# Patient Record
Sex: Female | Born: 1955 | Race: Black or African American | Hispanic: No | Smoking: Never smoker
Health system: Southern US, Community
[De-identification: ages and names within clinical notes are randomized; demographics above are authoritative.]

## PROBLEM LIST (undated history)

## (undated) DIAGNOSIS — D509 Iron deficiency anemia, unspecified: Secondary | ICD-10-CM

## (undated) DIAGNOSIS — F329 Major depressive disorder, single episode, unspecified: Secondary | ICD-10-CM

## (undated) DIAGNOSIS — E119 Type 2 diabetes mellitus without complications: Secondary | ICD-10-CM

## (undated) DIAGNOSIS — I10 Essential (primary) hypertension: Secondary | ICD-10-CM

## (undated) DIAGNOSIS — R232 Flushing: Secondary | ICD-10-CM

## (undated) DIAGNOSIS — M2550 Pain in unspecified joint: Secondary | ICD-10-CM

## (undated) DIAGNOSIS — F32A Depression, unspecified: Secondary | ICD-10-CM

## (undated) DIAGNOSIS — E669 Obesity, unspecified: Secondary | ICD-10-CM

## (undated) DIAGNOSIS — J449 Chronic obstructive pulmonary disease, unspecified: Secondary | ICD-10-CM

## (undated) DIAGNOSIS — E7800 Pure hypercholesterolemia, unspecified: Secondary | ICD-10-CM

## (undated) DIAGNOSIS — IMO0002 Reserved for concepts with insufficient information to code with codable children: Secondary | ICD-10-CM

## (undated) HISTORY — DX: Iron deficiency anemia, unspecified: D50.9

## (undated) HISTORY — DX: Obesity, unspecified: E66.9

## (undated) HISTORY — DX: Essential (primary) hypertension: I10

## (undated) HISTORY — DX: Major depressive disorder, single episode, unspecified: F32.9

## (undated) HISTORY — DX: Type 2 diabetes mellitus without complications: E11.9

## (undated) HISTORY — DX: Pain in unspecified joint: M25.50

## (undated) HISTORY — PX: OTHER SURGICAL HISTORY: SHX169

## (undated) HISTORY — PX: ABDOMINAL HYSTERECTOMY: SHX81

## (undated) HISTORY — PX: TUBAL LIGATION: SHX77

## (undated) HISTORY — DX: Reserved for concepts with insufficient information to code with codable children: IMO0002

## (undated) HISTORY — DX: Pure hypercholesterolemia, unspecified: E78.00

## (undated) HISTORY — DX: Depression, unspecified: F32.A

## (undated) HISTORY — DX: Flushing: R23.2

---

## 2003-11-07 ENCOUNTER — Inpatient Hospital Stay (HOSPITAL_COMMUNITY): Admission: AD | Admit: 2003-11-07 | Discharge: 2003-11-09 | Payer: Self-pay | Admitting: Internal Medicine

## 2003-11-16 ENCOUNTER — Ambulatory Visit (HOSPITAL_COMMUNITY): Admission: RE | Admit: 2003-11-16 | Discharge: 2003-11-16 | Payer: Self-pay | Admitting: Obstetrics and Gynecology

## 2003-11-22 ENCOUNTER — Other Ambulatory Visit: Admission: RE | Admit: 2003-11-22 | Discharge: 2003-11-22 | Payer: Self-pay | Admitting: Obstetrics and Gynecology

## 2004-07-27 ENCOUNTER — Inpatient Hospital Stay (HOSPITAL_COMMUNITY): Admission: EM | Admit: 2004-07-27 | Discharge: 2004-07-30 | Payer: Self-pay | Admitting: Emergency Medicine

## 2005-01-16 IMAGING — US US TRANSVAGINAL NON-OB
1 series · 14 of 25 positions shown · non-contrast
Comparison: none

HISTORY: Severe anemia, heavy menstrual bleeding

[Series 1: unknown · 0.32mm/px · 14 of 76 slices shown]
[im 1/76]
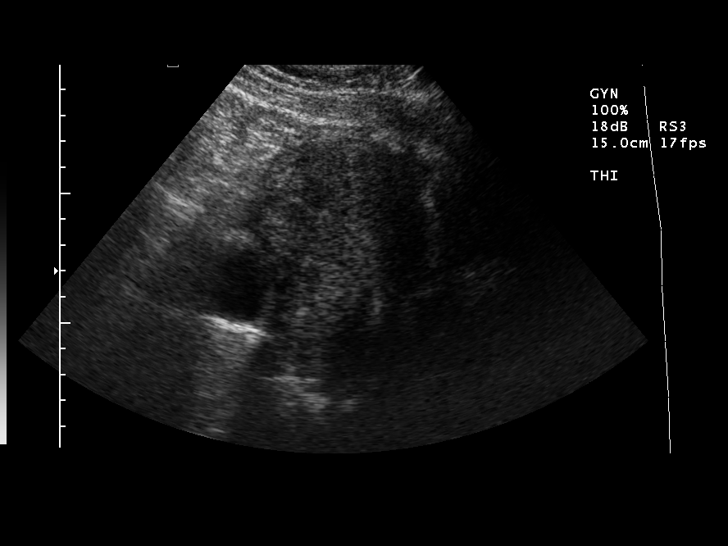
[im 7/76]
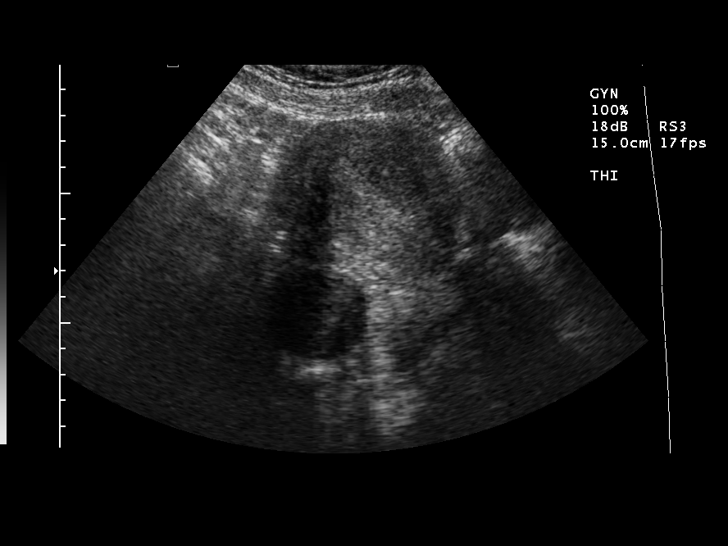
[im 13/76]
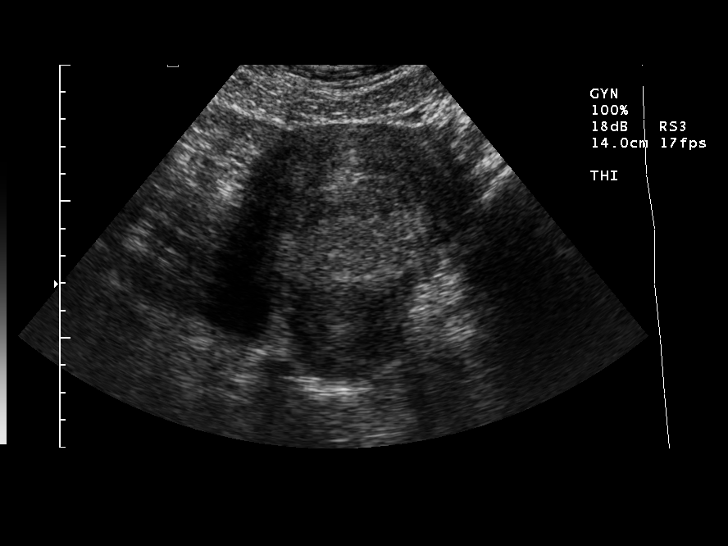
[im 19/76]
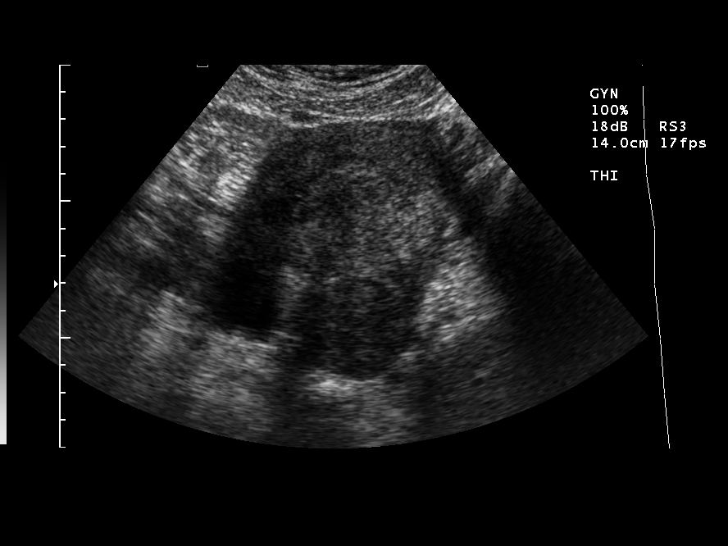
[im 26/76]
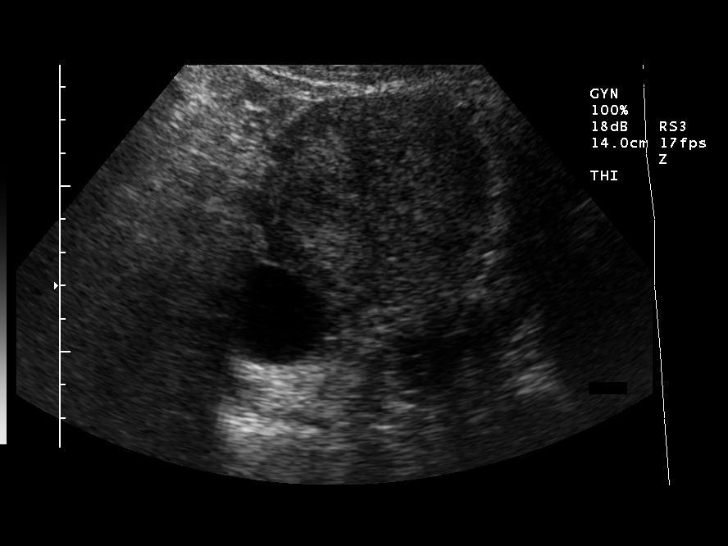
[im 29/76]
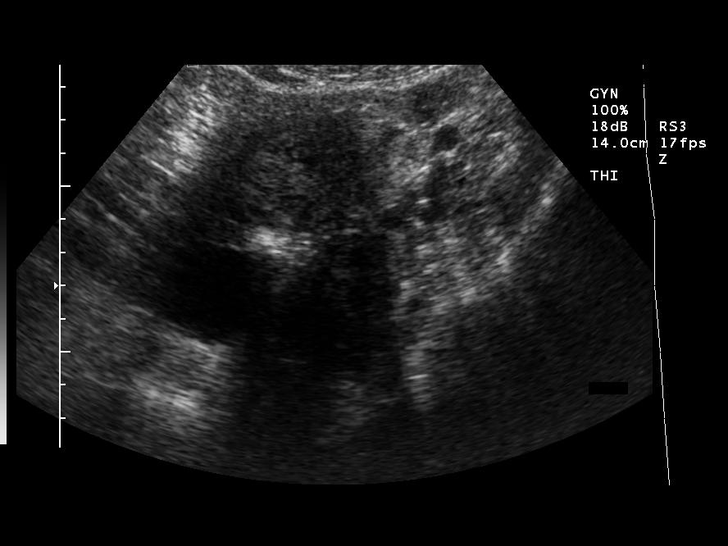
[im 35/76]
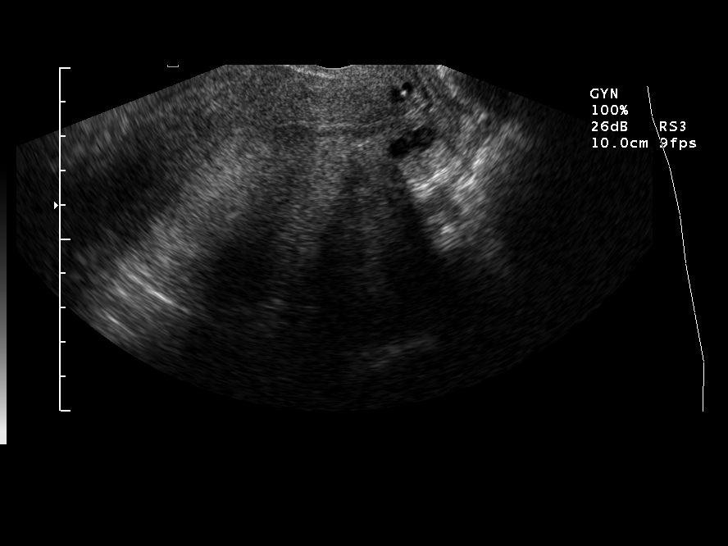
[im 41/76]
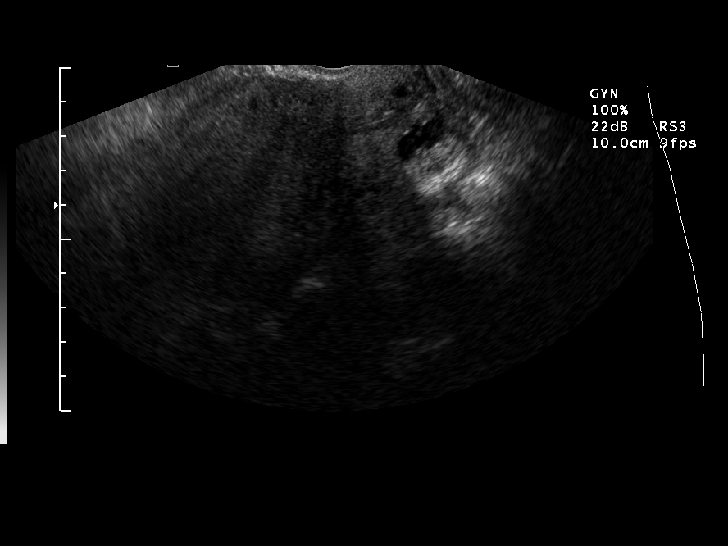
[im 47/76]
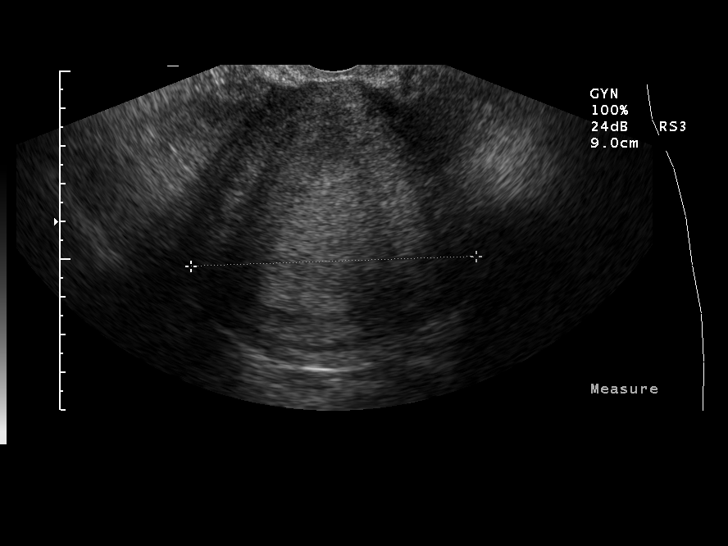
[im 51/76]
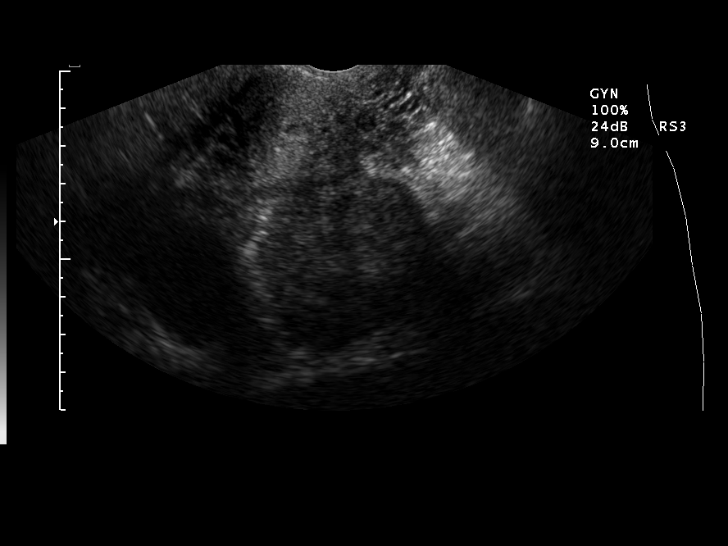
[im 57/76]
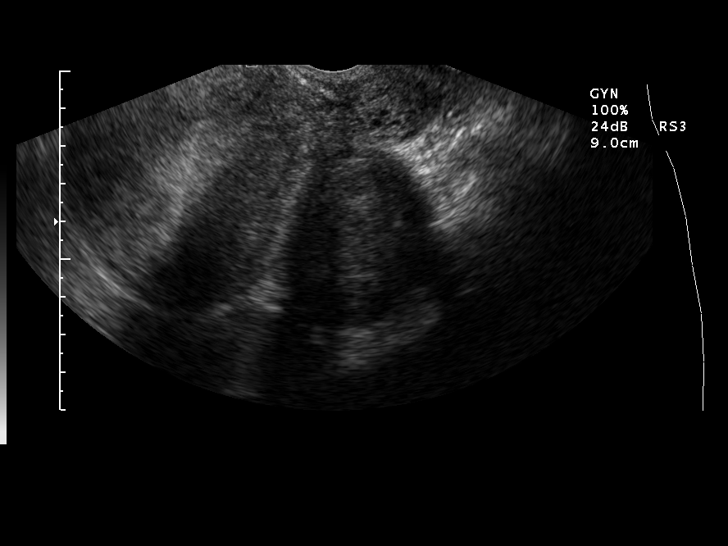
[im 63/76]
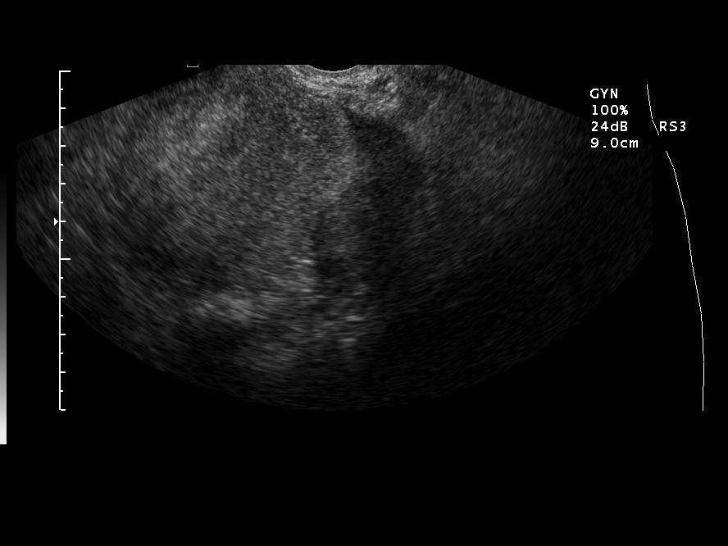
[im 69/76]
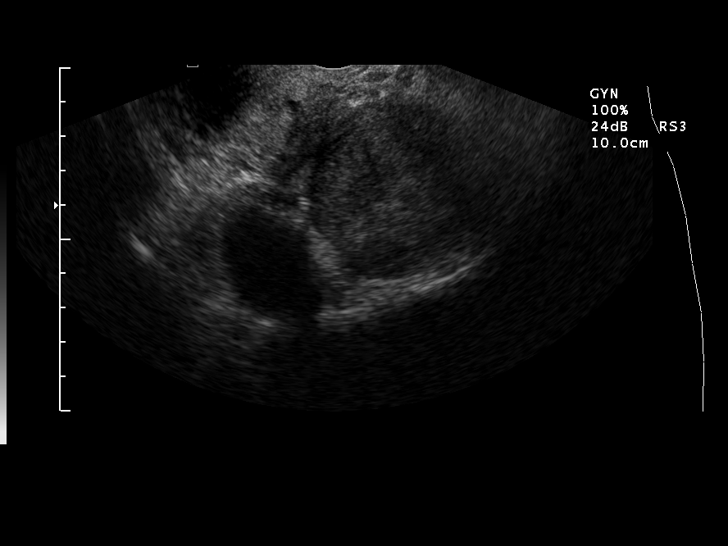
[im 76/76]
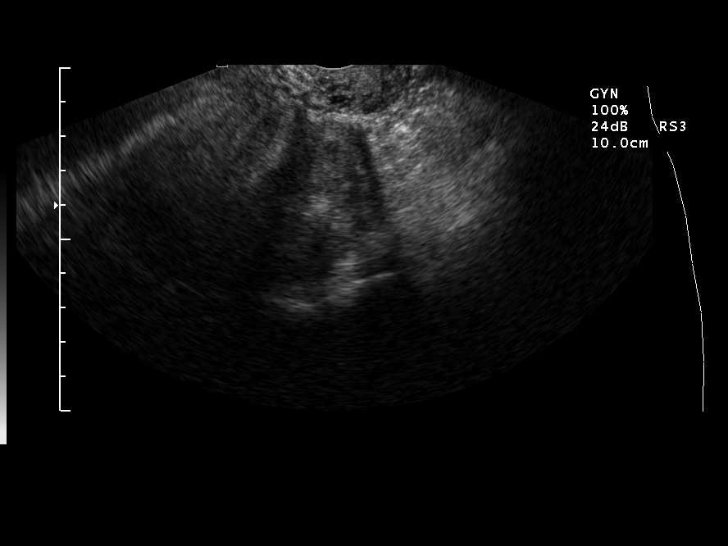

[14 of 25 positions shown; findings below may reference images not displayed]

ULTRASOUND PELVIS COMPLETE MODIFY:
ULTRASOUND PELVIS TRANSVAGINAL NON-OB:

Transabdominal and endovaginal sonography was performed.

Uterus 10.3 cm length x 6.8 cm AP x 7.8 cm transverse.
Endometrial stripe 12 mm thick, minimally prominent for age.
No free pelvic fluid.
Right ovary measures 4.5 x 3.0 x 3.0 cm.
Small simple right ovarian cyst, 3.2 x 2.6 x 3.0 cm.
Solid left adnexal mass identified, 5.3 x 3.8 x 4.7 cm. 
No definite internal blood flow on color Doppler imaging.
It is uncertain whether this represents an exophytic uterine fibroid or a left ovarian mass.
Left ovary not otherwise identified.
IMPRESSION: Small right ovarian cyst. Minimally prominent endometrial stripe.
5.3 cm diameter solid left adnexal mass, question of left ovary origin versus exophytic uterine
fibroid. Recommend additional evaluation by multiplanar MR imaging to localize and characterize.

## 2006-09-26 ENCOUNTER — Emergency Department (HOSPITAL_COMMUNITY): Admission: EM | Admit: 2006-09-26 | Discharge: 2006-09-27 | Payer: Self-pay | Admitting: Emergency Medicine

## 2006-10-08 ENCOUNTER — Ambulatory Visit (HOSPITAL_COMMUNITY): Admission: RE | Admit: 2006-10-08 | Discharge: 2006-10-08 | Payer: Self-pay | Admitting: Family Medicine

## 2006-10-12 ENCOUNTER — Ambulatory Visit (HOSPITAL_COMMUNITY): Admission: RE | Admit: 2006-10-12 | Discharge: 2006-10-12 | Payer: Self-pay | Admitting: Family Medicine

## 2006-11-24 ENCOUNTER — Inpatient Hospital Stay (HOSPITAL_COMMUNITY): Admission: AD | Admit: 2006-11-24 | Discharge: 2006-11-27 | Payer: Self-pay | Admitting: Obstetrics & Gynecology

## 2006-11-25 ENCOUNTER — Encounter: Payer: Self-pay | Admitting: Obstetrics & Gynecology

## 2007-12-13 IMAGING — US US PELVIS COMPLETE MODIFY
1 series · 14 of 25 positions shown · non-contrast
Comparison: none

HISTORY: Uterine enlargement

[Series 1: us pelvis complete modify · 0.26mm/px · 14 of 48 slices shown]
[im 1/48]
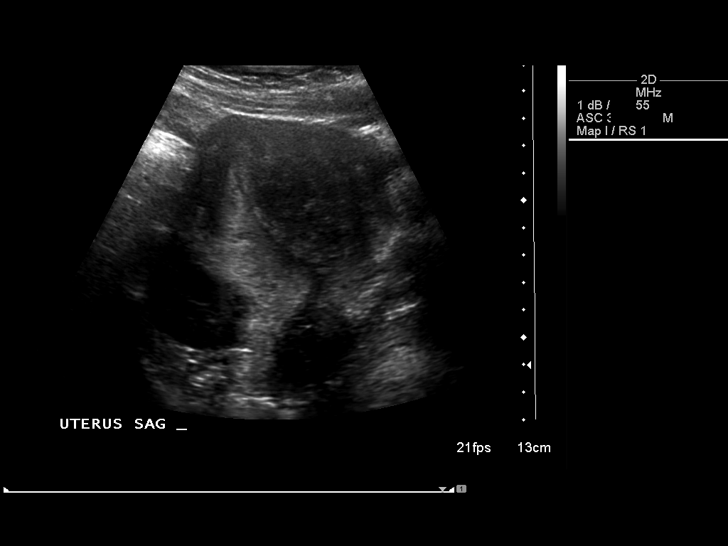
[im 4/48]
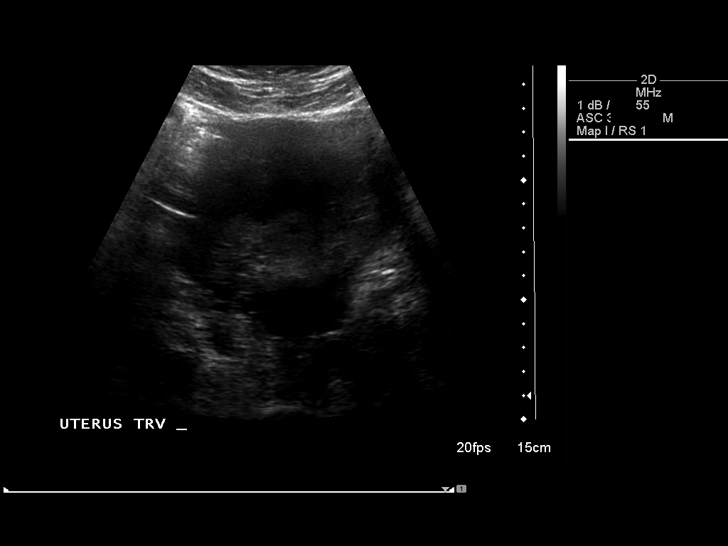
[im 8/48]
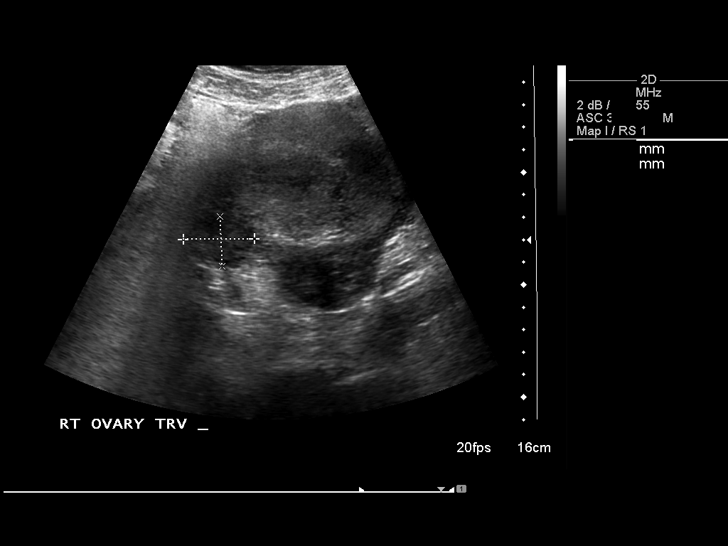
[im 12/48]
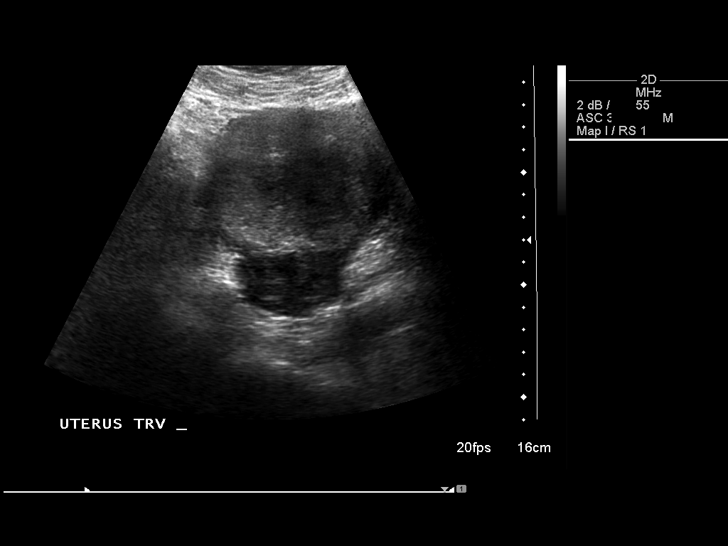
[im 16/48]
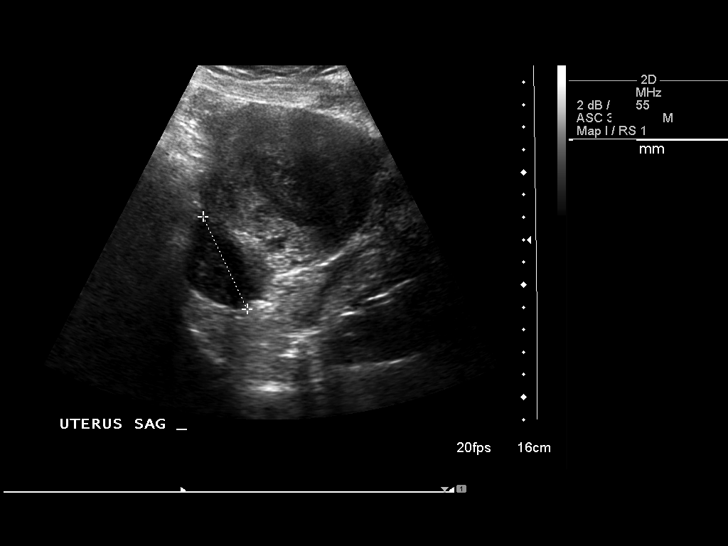
[im 18/48]
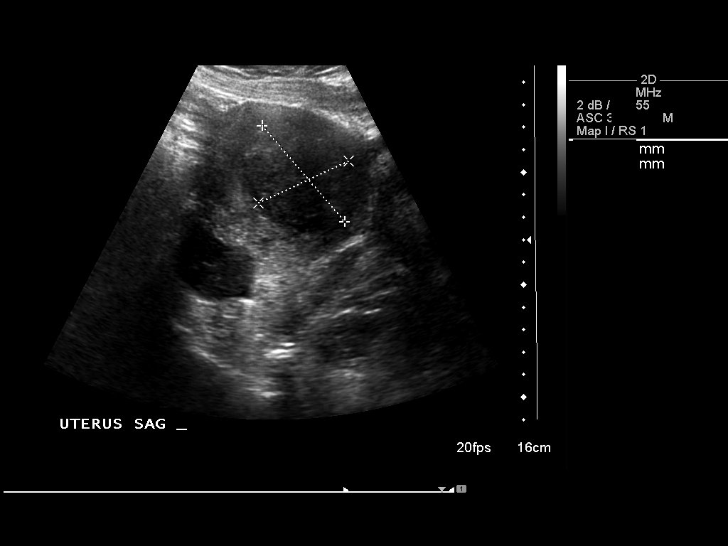
[im 22/48]
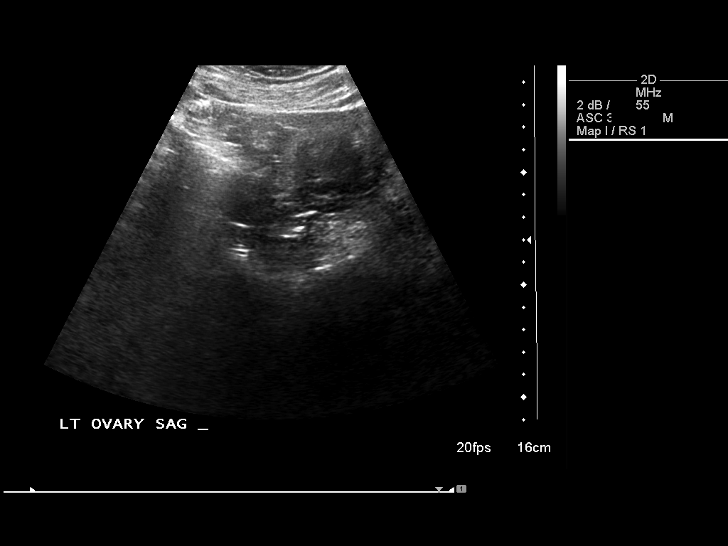
[im 26/48]
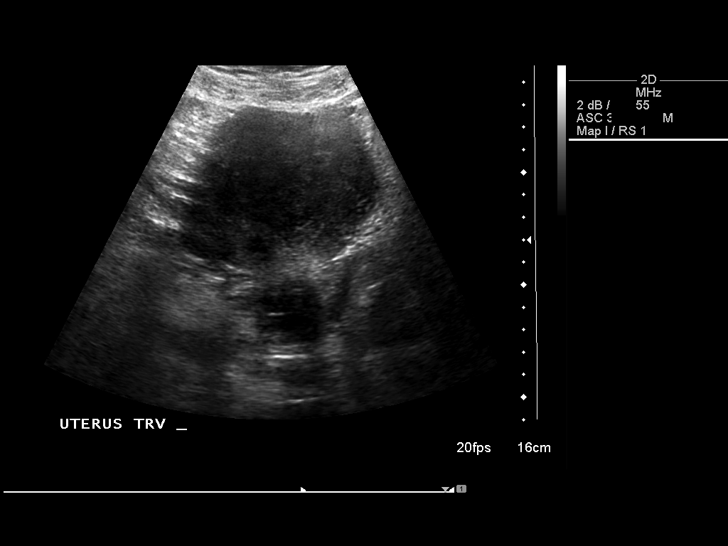
[im 30/48]
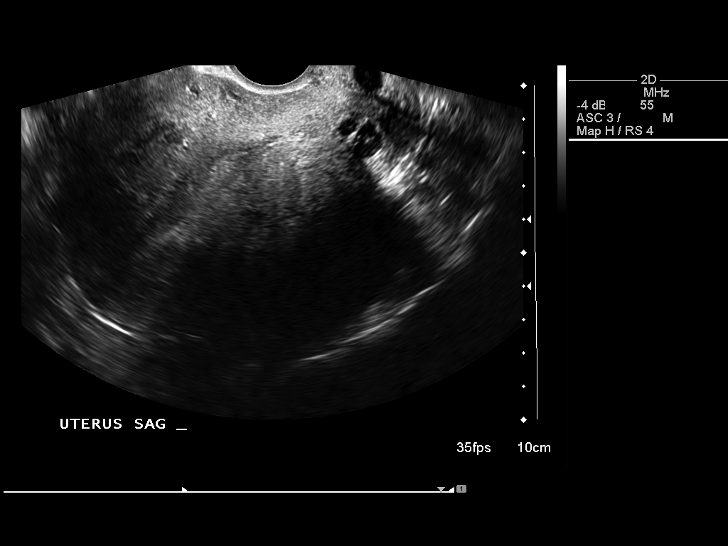
[im 32/48]
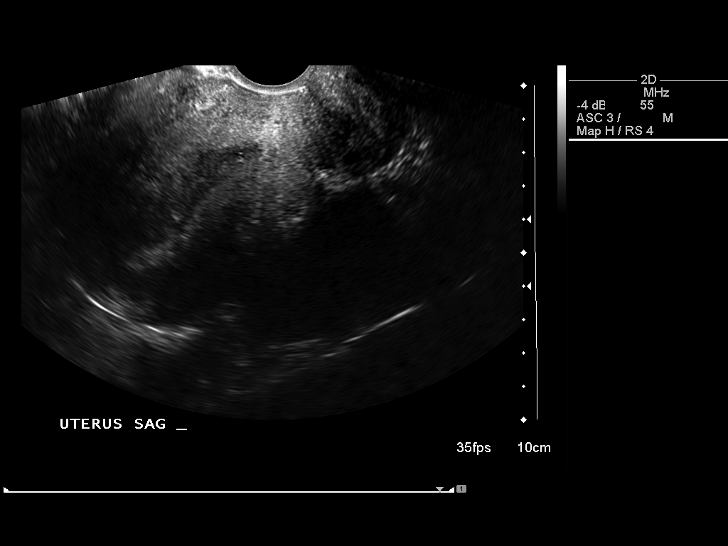
[im 36/48]
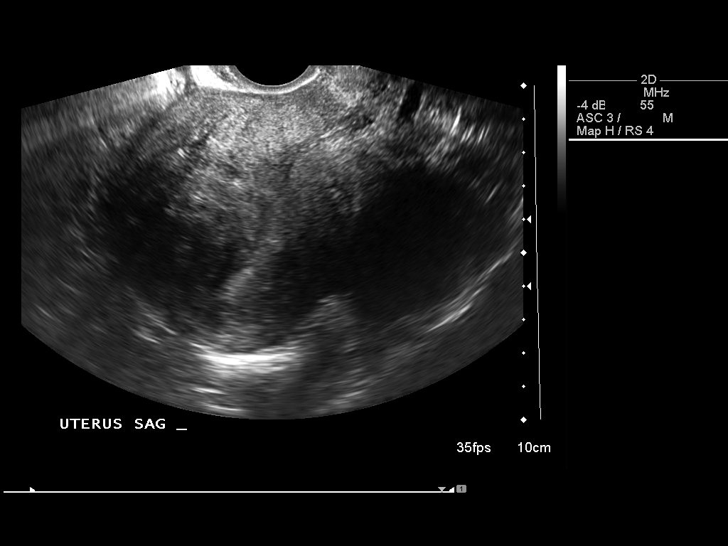
[im 40/48]
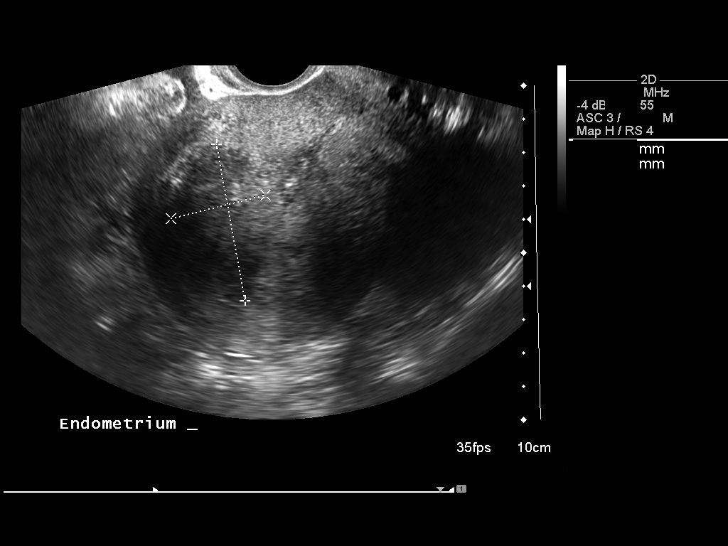
[im 44/48]
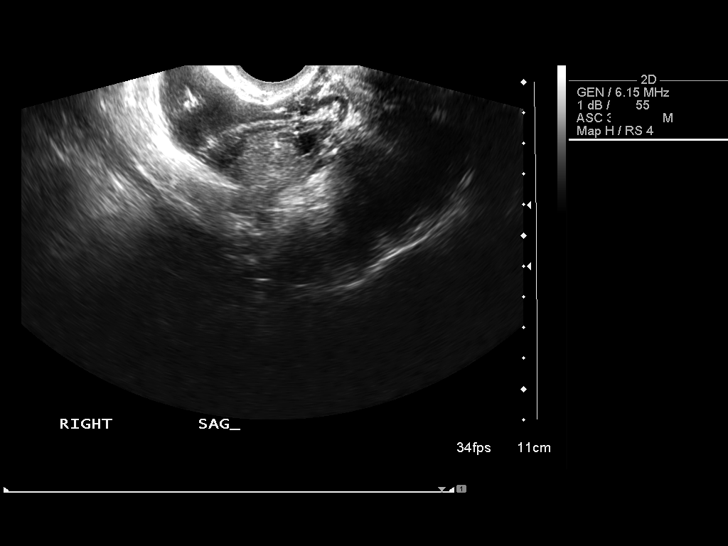
[im 48/48]
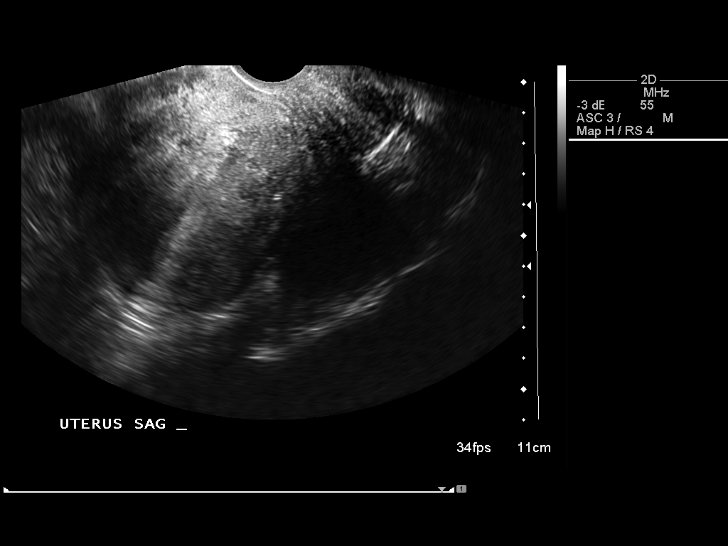

[14 of 25 positions shown; findings below may reference images not displayed]

ULTRASOUND PELVIS TRANSABDOMINAL COMPLETE MODIFIED:
ULTRASOUND PELVIS TRANSVAGINAL NON-OB:

Transabdominal and endovaginal sonography of pelvis performed.
Uterus, ovaries, adnexal regions, and pelvic cul-de-sac assessed.

Uterus measures 10.5 cm length x 6.6 cm AP x 8.3 cm transverse.
Endometrial stripe markedly thickened 16 mm diameter.
Multiple uterine fibroids identified.
Large anterior fibroid mid to upper uterine segment 5.6 x 4.4 x 4.9 cm,
submucosal.
Additional fibroid subserosal at mid to lower uterine segment, 5.2 x 3.3 x
cm.
No endometrial fluid or free pelvic fluid.
Right ovary normal size and morphology, 3.4 x 3.2 x 2.2 cm.
Left ovary normal size and morphology, 3.4 x 1.6 x 2.3 cm.
No adnexal masses.
IMPRESSION: Uterine leiomyomata with overall enlargement of uterus.
Abnormal thickening of endometrial stripe, nonspecific but can be seen with
hyperplasia, polyps, and tumor.
No adnexal abnormalities.

## 2009-06-11 ENCOUNTER — Emergency Department (HOSPITAL_COMMUNITY): Admission: EM | Admit: 2009-06-11 | Discharge: 2009-06-11 | Payer: Self-pay | Admitting: Emergency Medicine

## 2010-06-04 NOTE — H&P (Signed)
NAME:  Amanda [name], Amanda [name] NO.:  [id number]   MEDICAL RECORD NO.:  [id number]          PATIENT TYPE:  INP   LOCATION:  A309                          FACILITY:  [facility]   PHYSICIAN:  Lazaro Arms, M.D.   DATE OF BIRTH:  05/21/1955   DATE OF ADMISSION:  11/24/2006  DATE OF DISCHARGE:  LH                              HISTORY & PHYSICAL   Amanda [name] is a 55 year old African American female who has been seen over  the last year or so in the ER at [facility] and at [facility], at least on  a couple of different occasions, for heavy bleeding and severe anemia.  She was last seen in the ER at [facility] on September 6.  She has a  hemoglobin of 7.8 and 24.7.  We saw her for the first time in the  office, which I saw her on November 10, 2006, at which time her  fingerstick hemoglobin was 8.1.  She was having fatigue, dizzy spells.  At that time I did an exam and she had an enlarged fibroid uterus up to  the umbilicus and ultrasound in the office confirmed that it was an  enlarged fibroid.  I talked with her and her daughter for quite some  time regarding the fact that this was the reason she was having the  heavy vaginal bleeding.  She had been on birth control pills here in the  past and I told her I did not think that was appropriate going forward.  She has really suffered with this on and off for several years and I  recommended abdominal hysterectomy and bilateral salpingo-oophorectomy.  The patient agreed and I brought her in today actually to see if her  hemoglobin was appropriate for surgery or she would have to be  transfused prior to surgery, and it was 8.6 and 27 and I elected to  bring her in and give her 2 units of packed red blood cells  preoperatively, which she agreed with.   PAST MEDICAL HISTORY:  1. Anemia.  2. She has a history of hypertension.  3. She had what might have been a vascular event secondary to being on      birth control pills.   PAST SURGICAL  HISTORY:  Tubal ligation.   PAST OBSTETRICAL HISTORY:  She has had all vaginal deliveries.   She does not smoke, drink or do drugs.   MEDICATIONS:  Megace, which I have placed her on to stop her bleeding,  and they had actually called me about that from the ER as well.   Her last Pap smear was normal.   HEENT:  Normal.  Thyroid is normal.  LUNGS:  Clear.  HEART:  Regular rate and rhythm without murmur, rub, or gallop.  BREASTS:  Without masses, discharge or skin changes.  ABDOMEN:  Benign with a midline mass up to the umbilicus consistent with  a fibroid.  She has normal external genitalia.  The vagina is clean and without  discharge.  Cervix parous without lesions.  Uterus, again, enlarged up  to the umbilicus.  Adnexa  are negative.  EXTREMITIES:  Warm with no edema.  NEUROLOGIC:  Grossly intact.   IMPRESSION:  1. Enlarged fibroid uterus.  2. Severe anemia as a result of menometrorrhagia.  3. Menometrorrhagia.   PLAN:  The patient is admitted for abdominal hysterectomy and bilateral  salpingo-oophorectomy.  She is transfused preoperatively to provide  adequate H&H preoperatively.  The patient understands the rationale for  this.  She will undergo a TAH/BSO on November 25, 2006.      Lazaro Arms, M.D.  Electronically Signed     LHE/MEDQ  D:  11/24/2006  T:  11/25/2006  Job:  [id number]

## 2010-06-04 NOTE — Discharge Summary (Signed)
NAMELITTLE, BASHORE NO.:  [id number]   MEDICAL RECORD NO.:  [id number]          PATIENT TYPE:  INP   LOCATION:  A309                          FACILITY:  [facility]   PHYSICIAN:  Lazaro Arms, M.D.   DATE OF BIRTH:  09/13/1955   DATE OF ADMISSION:  11/24/2006  DATE OF DISCHARGE:  11/07/2008LH                               DISCHARGE SUMMARY   DISCHARGE DIAGNOSES:  1. Status post abdominal hysterectomy and bilateral salpingo-      oophorectomy.  2. Enlarged fibroid uterus.  3. Menometrorrhagia.  4. Anemia requiring transfusion prior to surgery.   Please refer to the history and physical for details of admission to the  hospital.   HOSPITAL COURSE:  The patient had a hematocrit of 27 preoperatively.  As  a result, she was admitted, underwent transfusion of 2 units of packed  red blood cells intraoperatively.  Her course was unremarkable.  She had  a blood loss of 800 mL.  Postoperatively, her H&H settled in at 9.4 and  29.  On postoperative day #2 she was afebrile.  She tolerated clear  liquids and regular diet.  She voided without symptoms.  She was  ambulatory.  Her incision was clean, dry and intact.  Her abdominal exam  was benign.  She was discharged to home on the morning of postoperative  day #2 in good, stable condition to follow up in the office next week to  have her staples removed.  She is discharged on Percocet and Motrin and  will be seen in the office prior to her scheduled appointment if needed.      Lazaro Arms, M.D.  Electronically Signed     LHE/MEDQ  D:  11/27/2006  T:  11/27/2006  Job:  [id number]

## 2010-06-04 NOTE — Op Note (Signed)
NAMEROXAN, YAMAMOTO NO.:  [id number]   MEDICAL RECORD NO.:  [id number]          PATIENT TYPE:  INP   LOCATION:  A309                          FACILITY:  [facility]   PHYSICIAN:  Lazaro Arms, M.D.   DATE OF BIRTH:  03-29-1955   DATE OF PROCEDURE:  11/25/2006  DATE OF DISCHARGE:                               OPERATIVE REPORT   PREOPERATIVE DIAGNOSIS:  1. Enlarged fibroid uterus.  2. Menometrorrhagia.  3. Severe anemia requiring transfusion.   POSTOPERATIVE DIAGNOSIS:  1. Enlarged fibroid uterus.  2. Menometrorrhagia.  3. Severe anemia requiring transfusion.   PROCEDURE:  Abdominal hysterectomy with bilateral salpingo-oophorectomy.   SURGEON:  Lazaro Arms, M.D.   ANESTHESIA:  General endotracheal.   FINDINGS:  The patient had an enlarged fibroid uterus.  She had a  dominant fibroid in the fundus of the uterus and she also had a lower  segment fibroid posteriorly.  Her ovaries and tubes were both adherent  to the posterior fibroid as well as the pelvic side wall as if she may  have had infection at some point in the past.  Otherwise peritoneal  cavity was normal and the cervix appeared to be normal.   DESCRIPTION OF PROCEDURE:  The patient was taken to the operating room,  placed in the supine position, underwent general endotracheal  anesthesia.  The vagina was prepped and the Foley catheter was placed.  The abdomen was prepped and draped in the usual sterile fashion.  A  Pfannenstiel skin incision was made and carried down sharply to the  rectus fascia, scored in the midline, and extended laterally.  The  fascia was taken off the muscles superiorly and inferiorly without  difficulty.  The muscles were divided.  The peritoneal cavity was  entered.  An Alexis self-retaining wound retractor was placed and the  upper abdomen was packed away.  The left round ligament was suture  ligated and cut.  The infundibulopelvic ligament on the right was  isolated,  clamped, cut, and doubly suture ligated.  The left round  ligament was suture ligated and cut and the right infundibulopelvic  ligament was clamped, cut, and doubly suture ligated.  The bladder was  pushed off of the lower uterine segment.  The uterine vessels were  skeletonized.  The uterine fundus was amputated.  The posterior myoma  was removed.  Serial pedicles were taken down the cervix through the  cardinal ligament with each pedicle being clamped, cut, and suture  ligated.  The vagina was then crossclamped, the cervix was removed.  Vaginal angle sutures were placed and the vagina was closed with  interrupted figure-of-eight sutures.  The pelvis was irrigated  vigorously multiple times.  All pedicles were made hemostatic.  Interceed was placed over the vagina.  The patient tolerated the  procedure well.  At this point she experienced 800 mL of blood loss.  The instruments were removed, the Alexis wound retractor was removed,  the muscles from the peritoneum reapproximated loosely, the fascia  closed using 0 Vicryl running,  subcutaneous tissues made hemostatic and irrigated.  Skin was  closed  using skin staples.  The patient tolerated the procedure well.  She  experienced 800 mL of blood loss and taken to the recovery room in good  stable condition.  All needle, sponge, and instrument counts correct x3.      Lazaro Arms, M.D.  Electronically Signed     LHE/MEDQ  D:  11/25/2006  T:  11/25/2006  Job:  [id number]

## 2010-06-07 NOTE — Op Note (Signed)
NAMEVICKE, Amanda [name]               ACCOUNT NO.:  [id number]   MEDICAL RECORD NO.:  [id number]          PATIENT TYPE:  INP   LOCATION:  A322                          FACILITY:  [facility]   PHYSICIAN:  Dirk Dress. Katrinka Blazing, M.D.   DATE OF BIRTH:  10/19/1955   DATE OF PROCEDURE:  07/28/2004  DATE OF DISCHARGE:                                 OPERATIVE REPORT   PREOPERATIVE DIAGNOSIS:  Pilonidal abscess.   POSTOPERATIVE DIAGNOSIS:  Pilonidal abscess.   PROCEDURE:  Wide excision and drainage of pilonidal abscess.   SURGEON:  Dirk Dress. Katrinka Blazing, MD.   DESCRIPTION OF PROCEDURE:  Under spinal anesthesia, the patient's presacral  and perianal area were prepped and draped in a sterile field.  The abscess  cavity was aspirated with a 21-gauge needle.  This fluid was sent for  cultures, aerobic and anaerobic.  The incision was made in the midline  around the small opening and extended down into the presacral area.  A large  volume of pus was removed.  The cavity extended for a length of about 8 cm  and was about 3 to 4 cm wide.  Inflammatory debris was curetted, and  hemostasis was achieved with electrocautery.  The wound was irrigated and  packed with Iodoform gauze.  The patient tolerated the procedure well.  A  sterile dressing was placed.  She was transferred to a bed and taken to the  post-anesthetic care unit for further monitoring.       LCS/MEDQ  D:  07/28/2004  T:  07/28/2004  Job:  [id number]

## 2010-06-07 NOTE — Consult Note (Signed)
Amanda [name], Amanda [name]               ACCOUNT NO.:  [id number]   MEDICAL RECORD NO.:  [id number]          PATIENT TYPE:  INP   LOCATION:  A320                          FACILITY:  [facility]   PHYSICIAN:  R. Roetta Sessions, M.D. DATE OF BIRTH:  [birth year]   DATE OF CONSULTATION:  11/08/2003  DATE OF DISCHARGE:                                   CONSULTATION   REASON FOR CONSULTATION:  Iron deficiency anemia, possible chronic GI bleed.   HISTORY OF PRESENT ILLNESS:  The patient is a 55 year old black female who  was admitted to the hospital with a two day history of nausea and vomiting  and diarrhea.  A couple of days before admission she developed nausea and  vomiting.  She vomited one single episode.  She denies any hematemesis.  Throughout that night she developed watery diarrhea.  She had a few watery  stools the following day and on the day of admission.  She felt very weak.  She was seen by Dr. Felecia Shelling in the office and was felt to be acutely ill and  needed admission.  Since admission she has had three watery stools.  She  denies any melena or rectal bleeding.  No abdominal pain.  No further  vomiting.  No heartburn symptoms.  She states she has regular menstrual  cycles at least once a month.  She has seven days of flow, four-five of  which is quite heavy and sometimes contains clots.  She says she had anemia  a couple of years ago and was hospitalized at [facility].  This was  discovered after she tried to donate blood.  She denies having any  transfusions or any endoscopies at that time.  She was given iron tablets.   On admission her hemoglobin was 7.1, hematocrit 24.2, MCV 61.8.  BUN 8,  creatinine 0.7.  Total bilirubin 0.8.  LFTs were normal except an albumin of  3.2.  Iron 14, TIBC 414, iron saturation is 3%, ferritin 17.  B12 of 963.  Folate 13.1.  Urinalysis was negative.  Hemoccult stool x1 was negative.  She received two units of packed red blood cells.  Her hemoglobin is  10.3,  hematocrit 32.6.  She has never had a colonoscopy or EGD.   MEDICATIONS PRIOR TO ADMISSION:  Occasional Advil for menstrual cramps.   ALLERGIES:  No known drug allergies.   PAST MEDICAL HISTORY:  Negative for chronic illnesses.  She gives a history  of anemia two years ago as outlined above.   PAST SURGICAL HISTORY:  Remote tubal ligation.   FAMILY HISTORY:  Negative for chronic GI _________ colorectal cancer.   OBJECTIVE:  She is recently unemployed.  She was working at [employer] but quit  a couple of weeks ago, as she became quit weak and did not feel well.  She  is separated.  She has two children.  She denies any tobacco, alcohol, or  drug use.   REVIEW OF SYSTEMS:  GASTROINTESTINAL:  See HPI.  GENERAL:  Complaints of  weakness.  CARDIOPULMONARY:  Denies any chest pain or shortness of breath.  PHYSICAL EXAMINATION:  VITAL SIGNS:  Temperature 98.4, pulse 85,  respirations 22, blood pressure 104/65.  GENERAL:  A pleasant well-nourished, well-developed black female in no acute  distress.  SKIN:  Warm and dry.  No jaundice.  HEENT:  The conjunctivae are slightly pale.  Sclerae nonicteric.  Oropharyngeal moist and pink.  No lesions, erythema, or exudates.  NECK:  No lymphadenopathy or thyromegaly.  LUNGS:  Clear to auscultation.  CARDIAC:  Reveals a regular rate and rhythm.  Normal S1 S2.  No murmurs,  rubs, or gallops.  ABDOMEN:  Positive bowel sounds.  Soft, nontender, and nondistended.  No  organomegaly or masses.  EXTREMITIES:  No edema.   LABS:  As mentioned in HPI.  In addition, WBC 7.7, platelets 275,000.  Sodium 133, potassium 3.5, BUN 8, creatinine 0.7, glucose 100.  Total  bilirubin 0.8, alkaline phosphatase 48, AST 24, ALT 22, albumin 3.2, amylase  90, lipase 23.   IMPRESSION/PLAN:  The patient is a 55 year old lady who was admitted with a  couple day history of nausea and vomiting and diarrhea.  She continues to  have watery diarrhea, although this has  lessened.  She may have a viral  gastroenteritis.  More concerning, her profound microcytic anemia with low  iron indices.  She admits to having regular heavy menstrual cycles.  She has  not seen any bloody stools.  Her stool is Hemoccult-negative x1.  Her iron  deficiency anemia may be explained by her heavy menstrual cycles.  However,  I would offer a colonoscopy.  We would consider a colonoscopy after  resolution of acute symptoms, possibly as an outpatient.  I will discuss  this further with Dr. Jena Gauss and further recommendations to be made.   I like to thank Dr. Felecia Shelling for allowing [place] to take part in the care of this  patient.     Lesl   LL/MEDQ  D:  11/08/2003  T:  11/08/2003  Job:  [id number]

## 2010-06-07 NOTE — H&P (Signed)
Amanda [name], Amanda [name]               ACCOUNT NO.:  [id number]   MEDICAL RECORD NO.:  [id number]          PATIENT TYPE:  INP   LOCATION:  A320                          FACILITY:  [facility]   PHYSICIAN:  Tesfaye D. Felecia Shelling, MD   DATE OF BIRTH:  [birth year]   DATE OF ADMISSION:  11/07/2003  DATE OF DISCHARGE:  LH                                HISTORY & PHYSICAL   CHIEF COMPLAINT:  Nausea, vomiting, diarrhea, and dizziness.   HISTORY OF PRESENT ILLNESS:  This is a 55 year old female patient with no  significant past medical history came to the office with the above  complaints.  The patient claims she developed nausea and vomiting about 3-4  days back.  This was accompanied with frequent watery diarrhea.  Her  symptoms progressively got worse.  She became dizzy and weak since this  morning.  She was evaluated in the office and was found to be acutely sick-  looking and very weak.  The patient was very pale.  She had no history of  hematemesis or melena.  However, the patient has a history of heavy  menstrual flow.  About two years back the patient claims she was found to be  anemic, and she was admitted to [facility] and was treated.   REVIEW OF SYSTEMS:  The patient has a headache.  No fever, cough, chest  pain, shortness of breath, dysuria, urgency, or frequency of urination.   PAST MEDICAL HISTORY:  History of anemia, two years back.  She was admitted  and treated here at [facility].  The patient does not know the  cause of her anemia.   MEDICATIONS:  The patient is not on any regular medication.   SOCIAL HISTORY:  The patient is currently unemployed.  She is separated from  her husband.  The patient has two children.  No history of alcohol, tobacco,  or substance abuse.   PHYSICAL EXAMINATION:  GENERAL:  The patient is alert, awake, and sick-  looking.  VITAL SIGNS:  Blood pressure 99/66, pulse 96, respiratory rate 16,  temperature 97 degrees Fahrenheit.  HEENT:   Pupils are equal reactive.  Conjunctivae are pale.  NECK:  Supple.  CHEST:  Clear lung fields, good air entry.  CARDIOVASCULAR:  First and second heart sounds heard.  No murmur, no gallop.  ABDOMEN:  Soft and lax.  Bowel sounds are positive.  No mass, no  organomegaly.  EXTREMITIES:  No leg edema.   LABS ON ADMISSION:  WBC 8.6, hemoglobin 7.1, hematocrit 24.2, platelets 280,  MCV 61.8.  Sodium 133, potassium 3.5, chloride 103, carbon dioxide 25,  glucose 100, BUN 8, creatinine 0.7.  Alkaline phosphatase 48, SGOT 24, SGPT  22, total protein 7.3, albumin 3.2, calcium 8.8.  Amylase 90 and lipase 23.  Urinalysis is negative.   ASSESSMENT:  1.  Acute gastroenteritis, probably a viral etiology.  2.  Severe microcytic anemia, etiology is not clear at this time.  The      differential diagnoses include chronic gastrointestinal blood loss or      probably dysfunctional uterine  bleeding.   PLAN:  1.  We will continue the patient on IV fluid.  2.  We will type, cross match, and transfuse two units of packed red blood      cells.  3.  We will do an anemia workup.  4.  We will do a GI consult for evaluation of possible chronic GI bleed.  5.  We will keep the patient on a clear liquid diet until her symptoms      totally resolve.     Tesf   TDF/MEDQ  D:  11/07/2003  T:  11/07/2003  Job:  [id number]

## 2010-06-07 NOTE — Discharge Summary (Signed)
Amanda [name], Amanda [name]               ACCOUNT NO.:  [id number]   MEDICAL RECORD NO.:  [id number]          PATIENT TYPE:  INP   LOCATION:  A320                          FACILITY:  [facility]   PHYSICIAN:  Tesfaye D. Felecia Shelling, MD   DATE OF BIRTH:  [birth year]   DATE OF ADMISSION:  DATE OF DISCHARGE:  10/20/2005LH                                 DISCHARGE SUMMARY   DISCHARGE DIAGNOSES:  1.  Acute gastroenteritis.  2.  Microcytic anemia.   DISCHARGE MEDICATIONS:  Ferrous sulfate 325 mg p.o. daily.   DISPOSITION:  The patient will be discharged home in a stable condition.   HOSPITAL COURSE:  This is a 55 year old female patient with a history of  anemia who was admitted due to nausea, vomiting and dizziness.  She was also  found to have severe anemia for which the patient received two units of  packed red blood cells.  Her occult stool test became negative.  However,  the patient has a history of prolonged menstrual flow.  Her symptoms of  nausea, vomiting and diarrhea stopped.  The patient was treated as a case of  acute gastroenteritis.  GI consultation was done, and the patient is planned  for outpatient colonoscopy.  The patient is going to be discharged home in  stable condition to be followed in the office.     Tesf   TDF/MEDQ  D:  11/09/2003  T:  11/09/2003  Job:  [id number]

## 2010-06-07 NOTE — Discharge Summary (Signed)
[name], [name]               ACCOUNT NO.:  [id number]   MEDICAL RECORD NO.:  [id number]          PATIENT TYPE:  INP   LOCATION:  A322                          FACILITY:  [facility]   PHYSICIAN:  Dirk Dress. Katrinka Blazing, M.D.   DATE OF BIRTH:  [birth year]   DATE OF ADMISSION:  07/27/2004  DATE OF DISCHARGE:  07/11/2006LH                                 DISCHARGE SUMMARY   DISCHARGE DIAGNOSIS:  Pilonidal abscess.   PROCEDURE:  Wide excision and drainage of pilonidal abscess on July 9.   DISPOSITION:  The patient discharged home in stable, satisfactory condition.   DISCHARGE MEDICATIONS:  1.  Augmentin 1000 mg daily.  2.  Ferrous sulfate 325 mg daily.  3.  Darvocet-N 100 1-2 every 4 hours as needed for pain.   FOLLOW UP:  The patient is scheduled to be seen in the office in 2 weeks.   WOUND CARE:  She is advised to flushed the wound with warm soap and water.  Cover the area with perineal pad twice a day.   HOSPITAL COURSE:  This is a 55 year old female with a 4-week history of a  painful presacral mass with gradual increase in size over the past few days  prior to admission.  The pain became intolerable.  She was seen in the  emergency room and was found to have a very large pilonidal abscess.  She  was otherwise clinically stable.  She was observed overnight and taken to  the operating room the following morning were wide excision of the area was  carried out.  She did very well and was discharged home on the afternoon of  postop day #2 in satisfactory condition.      Dirk Dress. Katrinka Blazing, M.D.  Electronically Signed     LCS/MEDQ  D:  09/15/2004  T:  09/16/2004  Job:  [id number]

## 2010-09-25 ENCOUNTER — Emergency Department (HOSPITAL_COMMUNITY)
Admission: EM | Admit: 2010-09-25 | Discharge: 2010-09-25 | Disposition: A | Discharge status: Home / Self Care | Payer: Self-pay | Attending: Emergency Medicine | Admitting: Emergency Medicine

## 2010-09-25 DIAGNOSIS — L0231 Cutaneous abscess of buttock: Secondary | ICD-10-CM | POA: Insufficient documentation

## 2010-09-25 DIAGNOSIS — H9209 Otalgia, unspecified ear: Secondary | ICD-10-CM | POA: Insufficient documentation

## 2010-09-25 MED ORDER — SULFAMETHOXAZOLE-TRIMETHOPRIM 800-160 MG PO TABS: 1.0000 | ORAL_TABLET | Freq: Two times a day (BID) | ORAL | Status: AC

## 2010-09-25 NOTE — ED Notes (Signed)
1. Left earache and facial swelling x 1 month 2,. Wants abcess to sacral area checked, first noted 2 weeks ago, has been draining and is painful

## 2010-09-25 NOTE — ED Notes (Signed)
Pt self ambulated with a steady gait out of the er stating no needs

## 2010-09-25 NOTE — ED Provider Notes (Signed)
History     CSN: [id number] Arrival date & time: 09/25/2010  6:30 AM  Chief Complaint  Patient presents with  . Otalgia  . Wound Check   HPI Comments: Patient presents with 2 complaints, first is left ear pain for 1 month which is constant, associated with decreased hearing in the ear and pain. Nothing makes better or worse, symptoms are moderate at this time. She denies toothaches, fever, visual changes, sore throat, difficulty breathing.  Next complaint is abscess to the gluteal crease. She states this has been going on for more than a week, she has been using warm soaks in the tub and noticed that it drained prior to arrival today with some pus and some blood. The symptoms are mild, persistent, gradually improving.  Patient is a 55 y.o. female presenting with ear pain and wound check. The history is provided by the patient.  Otalgia Pertinent negatives include no rhinorrhea, no sore throat, no abdominal pain, no diarrhea, no vomiting and no cough.  Wound Check     History reviewed. No pertinent past medical history.  Past Surgical History  Procedure Date  . Abdominal hysterectomy   . Tubal ligation     History reviewed. No pertinent family history.  History  Substance Use Topics  . Smoking status: Never Smoker   . Smokeless tobacco: Not on file  . Alcohol Use: No    OB History    Grav Para Term Preterm Abortions TAB SAB Ect Mult Living                  Review of Systems  Constitutional: Negative for fever and chills.  HENT: Positive for ear pain. Negative for sore throat, rhinorrhea and dental problem.   Eyes: Negative for visual disturbance.  Respiratory: Negative for cough, chest tightness and shortness of breath.   Gastrointestinal: Negative for nausea, vomiting, abdominal pain, diarrhea and constipation.  Skin:       Abscess    Physical Exam  BP 136/76  Pulse 94  Temp(Src) 98 F (36.7 C) (Oral)  Resp 20  Ht 5\' 8"  (1.727 m)  Wt 160 lb (72.576 kg)  BMI  24.33 kg/m2  SpO2 98%  Physical Exam  Nursing note and vitals reviewed. Constitutional: She appears well-developed and well-nourished. No distress.  HENT:  Head: Normocephalic and atraumatic.  Mouth/Throat: Oropharynx is clear and moist. No oropharyngeal exudate.       Right tympanic membrane normal, left tympanic membrane is opacified with a yellow purulent liquid nature of behind it. No tenderness with manipulation of the auricle or the tragus.  Eyes: Conjunctivae and EOM are normal. Pupils are equal, round, and reactive to light. Right eye exhibits no discharge. Left eye exhibits no discharge. No scleral icterus.  Neck: Normal range of motion. Neck supple. No JVD present. No thyromegaly present.  Cardiovascular: Normal rate, regular rhythm, normal heart sounds and intact distal pulses.  Exam reveals no gallop and no friction rub.   No murmur heard. Pulmonary/Chest: Effort normal and breath sounds normal. No respiratory distress. She has no wheezes. She has no rales.  Abdominal: Soft. Bowel sounds are normal. She exhibits no distension and no mass. There is no tenderness.  Musculoskeletal: Normal range of motion. She exhibits no edema and no tenderness.  Lymphadenopathy:    She has no cervical adenopathy.  Neurological: She is alert. Coordination normal.  Skin: Skin is warm and dry. No erythema.       0.5 cm nodule consistent with drained  abscess in the mid gluteal cleft. No surrounding cellulitis. No perirectal abscess.  Psychiatric: She has a normal mood and affect. Her behavior is normal.    ED Course  Procedures  MDM Overall patient is well appearing with no fever or tachycardia. We'll prescribe antibiotics to cover both the ear and potential abscess. Will take twice a day for 10 days Septra and return to see ear nose and throat specialist who I have referred her to on her paperwork. She has expressed her understanding to      Vida Roller, MD 09/25/10 [phone]

## 2010-10-29 LAB — COMPREHENSIVE METABOLIC PANEL
ALT: 24
AST: 20
Albumin: 3.6
Alkaline Phosphatase: 47
BUN: 15
CO2: 26
Calcium: 9.4
Chloride: 108
Creatinine, Ser: 0.75
GFR calc Af Amer: >60
GFR calc non Af Amer: >60
Glucose, Bld: 100 — ABNORMAL HIGH
Potassium: 3.8
Sodium: 138
Total Bilirubin: 0.5
Total Protein: 7.3

## 2010-10-29 LAB — URINALYSIS, ROUTINE W REFLEX MICROSCOPIC
Bilirubin Urine: NEGATIVE
Glucose, UA: NEGATIVE
Hgb urine dipstick: NEGATIVE
Ketones, ur: NEGATIVE
Nitrite: NEGATIVE
Protein, ur: NEGATIVE
Specific Gravity, Urine: >1.03 — ABNORMAL HIGH
Urobilinogen, UA: 0.2
pH: 5.5

## 2010-10-29 LAB — ABO/RH: ABO/RH(D): B POS

## 2010-10-29 LAB — DIFFERENTIAL
Basophils Absolute: 0
Basophils Absolute: 0
Basophils Absolute: 0
Basophils Absolute: 0.1
Basophils Relative: 0
Basophils Relative: 0
Basophils Relative: 0
Basophils Relative: 1
Eosinophils Absolute: 0
Eosinophils Absolute: 0.1
Eosinophils Absolute: 0.1
Eosinophils Absolute: 0.1
Eosinophils Relative: 0
Eosinophils Relative: 1
Eosinophils Relative: 2
Eosinophils Relative: 2
Lymphocytes Relative: 18
Lymphocytes Relative: 24
Lymphocytes Relative: 29
Lymphocytes Relative: 8 — ABNORMAL LOW
Lymphs Abs: 1
Lymphs Abs: 1.5
Lymphs Abs: 1.9
Lymphs Abs: 2.1
Monocytes Absolute: 0.4
Monocytes Absolute: 0.7
Monocytes Absolute: 0.8 — ABNORMAL HIGH
Monocytes Absolute: 0.9 — ABNORMAL HIGH
Monocytes Relative: 7
Monocytes Relative: 7
Monocytes Relative: 7
Monocytes Relative: 8
Neutro Abs: 11 — ABNORMAL HIGH
Neutro Abs: 3.3
Neutro Abs: 5.8
Neutro Abs: 8 — ABNORMAL HIGH
Neutrophils Relative %: 62
Neutrophils Relative %: 66
Neutrophils Relative %: 74
Neutrophils Relative %: 85 — ABNORMAL HIGH

## 2010-10-29 LAB — CBC
HCT: 27 — ABNORMAL LOW
HCT: 27.8 — ABNORMAL LOW
HCT: 28.7 — ABNORMAL LOW
HCT: 32.9 — ABNORMAL LOW
Hemoglobin: 10.7 — ABNORMAL LOW
Hemoglobin: 8.6 — ABNORMAL LOW
Hemoglobin: 9 — ABNORMAL LOW
Hemoglobin: 9.4 — ABNORMAL LOW
MCHC: 31.9
MCHC: 32.3
MCHC: 32.7
MCHC: 32.8
MCV: 68.4 — ABNORMAL LOW
MCV: 72.4 — ABNORMAL LOW
MCV: 72.9 — ABNORMAL LOW
MCV: 73.6 — ABNORMAL LOW
Platelets: 297
Platelets: 324
Platelets: 335
Platelets: 355
RBC: 3.78 — ABNORMAL LOW
RBC: 3.94
RBC: 3.95
RBC: 4.54
RDW: 19.8 — ABNORMAL HIGH
RDW: 21.4 — ABNORMAL HIGH
RDW: 21.6 — ABNORMAL HIGH
RDW: 22 — ABNORMAL HIGH
WBC: 10.8 — ABNORMAL HIGH
WBC: 12.9 — ABNORMAL HIGH
WBC: 5.3
WBC: 8.9

## 2010-10-29 LAB — CROSSMATCH
ABO/RH(D): B POS
Antibody Screen: NEGATIVE

## 2010-10-29 LAB — HEMOGLOBIN AND HEMATOCRIT, BLOOD
HCT: 27.5 — ABNORMAL LOW
Hemoglobin: 8.9 — ABNORMAL LOW

## 2010-10-29 LAB — PREPARE RBC (CROSSMATCH)

## 2010-10-29 LAB — HCG, SERUM, QUALITATIVE: Preg, Serum: NEGATIVE

## 2010-11-01 LAB — DIFFERENTIAL
Basophils Absolute: 0
Basophils Relative: 0
Eosinophils Absolute: 0.1
Eosinophils Relative: 1
Lymphocytes Relative: 33
Lymphs Abs: 1.8
Monocytes Absolute: 0.3
Monocytes Relative: 6
Neutro Abs: 3.4
Neutrophils Relative %: 60

## 2010-11-01 LAB — CBC
HCT: 24.7 — ABNORMAL LOW
Hemoglobin: 7.8 — CL
MCHC: 31.7
MCV: 66.5 — ABNORMAL LOW
Platelets: 326
RBC: 3.72 — ABNORMAL LOW
RDW: 22.3 — ABNORMAL HIGH
WBC: 5.6

## 2010-11-01 LAB — BASIC METABOLIC PANEL
BUN: 12
CO2: 25
Calcium: 9.4
Chloride: 105
Creatinine, Ser: 0.67
GFR calc Af Amer: >60
GFR calc non Af Amer: >60
Glucose, Bld: 144 — ABNORMAL HIGH
Potassium: 3.3 — ABNORMAL LOW
Sodium: 138

## 2012-07-21 DIAGNOSIS — R079 Chest pain, unspecified: Secondary | ICD-10-CM

## 2012-11-11 ENCOUNTER — Telehealth: Payer: Self-pay

## 2012-11-11 NOTE — Telephone Encounter (Signed)
Pt called this afternoon. She said that this number called her and what did I want. I asked her to identify herself and I would see who may have tried calling her. She refused to tell me a name and hung up. I put in the phone number that called and it looks like DS is trying to reach patient for a triage. [phone]

## 2012-11-11 NOTE — Telephone Encounter (Signed)
Pt was referred by [facility] Dept for screening colonoscopy. ( FH of colon polyps/ not sure what kind). Tried to call pt and could not leave a VM. Letter mailed to pt to call.

## 2012-11-15 NOTE — Telephone Encounter (Signed)
Tried to call to triage. Could not leave a message.

## 2012-11-23 ENCOUNTER — Other Ambulatory Visit: Payer: Self-pay

## 2012-11-23 DIAGNOSIS — Z1211 Encounter for screening for malignant neoplasm of colon: Secondary | ICD-10-CM

## 2012-11-23 NOTE — Telephone Encounter (Signed)
Gastroenterology Pre-Procedure Review  Request Date: 11/23/2012 Requesting Physician: Kizzie Furnish  [facility])  PATIENT REVIEW QUESTIONS: The patient responded to the following health history questions as indicated:    1. Diabetes Melitis: no 2. Joint replacements in the past 12 months: no 3. Major health problems in the past 3 months: no 4. Has an artificial valve or MVP: no 5. Has a defibrillator: no 6. Has been advised in past to take antibiotics in advance of a procedure like teeth cleaning: no    MEDICATIONS & ALLERGIES:    Patient reports the following regarding taking any blood thinners:   Plavix? no Aspirin? YES Coumadin? no  Patient confirms/reports the following medications:  Current Outpatient Prescriptions  Medication Sig Dispense Refill  . aspirin 81 MG tablet Take 81 mg by mouth daily.      . cholecalciferol (VITAMIN D) 1000 UNITS tablet Take 1,000 Units by mouth daily. Takes 2000 IU daily      . fish oil-omega-3 fatty acids 1000 MG capsule Take 2 g by mouth daily.      . NON FORMULARY Ventolin Inhaler     As directed prn       No current facility-administered medications for this visit.    Patient confirms/reports the following allergies:  No Known Allergies  No orders of the defined types were placed in this encounter.    AUTHORIZATION INFORMATION Primary Insurance:   ID #:   Group #:  Pre-Cert / Auth required: Pre-Cert / Auth #:   Secondary Insurance:   ID #:   Group #:  Pre-Cert / Auth required: Pre-Cert / Auth #:   SCHEDULE INFORMATION: Procedure has been scheduled as follows:  Date:12/06/2012               Time:  2:00 PM Location: [facility] Short Stay  This Gastroenterology Pre-Precedure Review Form is being routed to the following provider(s): R. Roetta Sessions, MD

## 2012-11-24 ENCOUNTER — Ambulatory Visit: Payer: Self-pay | Admitting: Family Medicine

## 2012-11-24 MED ORDER — PEG-KCL-NACL-NASULF-NA ASC-C 100 G PO SOLR: 1.0000 | ORAL | Status: DC

## 2012-11-24 NOTE — Telephone Encounter (Signed)
Rx sent to the pharmacy and instructions mailed to pt.  

## 2012-11-24 NOTE — Telephone Encounter (Signed)
Appropriate.

## 2012-11-25 ENCOUNTER — Encounter (HOSPITAL_COMMUNITY): Payer: Self-pay | Admitting: Pharmacy Technician

## 2012-11-26 ENCOUNTER — Other Ambulatory Visit (HOSPITAL_COMMUNITY): Payer: Self-pay | Admitting: Family Medicine

## 2012-11-26 DIAGNOSIS — Z139 Encounter for screening, unspecified: Secondary | ICD-10-CM

## 2012-11-30 ENCOUNTER — Telehealth: Payer: Self-pay

## 2012-11-30 NOTE — Telephone Encounter (Signed)
Pt called and said she had gotten her prescription for the prep, but has not received the prep instructions. I confirmed her address again and sent another copy. ( It was mailed on 11/24/2012). I went over the instructions with her and she will call back on Friday morning if she has not received them. Her procedure is scheduled for 12/06/2012.).

## 2012-12-06 ENCOUNTER — Ambulatory Visit (HOSPITAL_COMMUNITY)
Admission: RE | Admit: 2012-12-06 | Discharge: 2012-12-06 | Disposition: A | Discharge status: Home / Self Care | Payer: Medicaid Other | Source: Ambulatory Visit | Attending: Internal Medicine | Admitting: Internal Medicine

## 2012-12-06 ENCOUNTER — Encounter (HOSPITAL_COMMUNITY)
Admission: RE | Disposition: A | Discharge status: Home / Self Care | Payer: Self-pay | Source: Ambulatory Visit | Attending: Internal Medicine

## 2012-12-06 ENCOUNTER — Encounter (HOSPITAL_COMMUNITY): Payer: Self-pay

## 2012-12-06 DIAGNOSIS — D126 Benign neoplasm of colon, unspecified: Secondary | ICD-10-CM

## 2012-12-06 DIAGNOSIS — Z83719 Family history of colon polyps, unspecified: Secondary | ICD-10-CM | POA: Insufficient documentation

## 2012-12-06 DIAGNOSIS — J449 Chronic obstructive pulmonary disease, unspecified: Secondary | ICD-10-CM | POA: Insufficient documentation

## 2012-12-06 DIAGNOSIS — Z1211 Encounter for screening for malignant neoplasm of colon: Secondary | ICD-10-CM

## 2012-12-06 DIAGNOSIS — Z8371 Family history of colonic polyps: Secondary | ICD-10-CM

## 2012-12-06 DIAGNOSIS — J4489 Other specified chronic obstructive pulmonary disease: Secondary | ICD-10-CM | POA: Insufficient documentation

## 2012-12-06 HISTORY — PX: COLONOSCOPY: SHX5424

## 2012-12-06 HISTORY — DX: Chronic obstructive pulmonary disease, unspecified: J44.9

## 2012-12-06 SURGERY — COLONOSCOPY
Anesthesia: Moderate Sedation

## 2012-12-06 MED ORDER — ONDANSETRON HCL 4 MG/2ML IJ SOLN
INTRAMUSCULAR | Status: DC
Start: 2012-12-06 — End: 2012-12-06
  Filled 2012-12-06: qty 2

## 2012-12-06 MED ORDER — SODIUM CHLORIDE 0.9 % IV SOLN
INTRAVENOUS | Status: DC
  Administered 2012-12-06: 13:00:00 via INTRAVENOUS

## 2012-12-06 MED ORDER — MIDAZOLAM HCL 5 MG/5ML IJ SOLN
INTRAMUSCULAR | Status: DC | PRN
  Administered 2012-12-06 (×2): 2 mg via INTRAVENOUS
  Administered 2012-12-06: 1 mg via INTRAVENOUS

## 2012-12-06 MED ORDER — MEPERIDINE HCL 100 MG/ML IJ SOLN
INTRAMUSCULAR | Status: DC | PRN
  Administered 2012-12-06 (×2): 25 mg via INTRAVENOUS
  Administered 2012-12-06: 50 mg via INTRAVENOUS

## 2012-12-06 MED ORDER — MIDAZOLAM HCL 5 MG/5ML IJ SOLN
INTRAMUSCULAR | Status: DC
Start: 2012-12-06 — End: 2012-12-06
  Filled 2012-12-06: qty 10

## 2012-12-06 MED ORDER — SIMETHICONE 40 MG/0.6ML PO SUSP
ORAL | Status: DC | PRN
  Administered 2012-12-06: 14:00:00

## 2012-12-06 MED ORDER — ONDANSETRON HCL 4 MG/2ML IJ SOLN
INTRAMUSCULAR | Status: DC | PRN
  Administered 2012-12-06: 4 mg via INTRAVENOUS

## 2012-12-06 MED ORDER — MEPERIDINE HCL 100 MG/ML IJ SOLN
INTRAMUSCULAR | Status: AC
  Filled 2012-12-06: qty 2

## 2012-12-06 NOTE — Op Note (Signed)
[facility] [address] [place], [address]   COLONOSCOPY PROCEDURE REPORT  PATIENT: Amanda [name], Amanda [name]  MR#:         [id number] BIRTHDATE: 11-28-1955 , 56  yrs. old GENDER: Female ENDOSCOPIST: R.  Roetta Sessions, MD FACP [facility] REFERRED BY:  Health Department [facility] PROCEDURE DATE:  12/06/2012 PROCEDURE:     Ileocolonoscopy with snare polypectomy  INDICATIONS: First ever colorectal cancer screening examination; younger sister is in a polyp surveillance program by report.  INFORMED CONSENT:  The risks, benefits, alternatives and imponderables including but not limited to bleeding, perforation as well as the possibility of a missed lesion have been reviewed.  The potential for biopsy, lesion removal, etc. have also been discussed.  Questions have been answered.  All parties agreeable. Please see the history and physical in the medical record for more information.  MEDICATIONS: Versed 5 mg IV and Demerol 100 mg IV in divided doses. Zofran 4 mg IV.  DESCRIPTION OF PROCEDURE:  After a digital rectal exam was performed, the EC-3890Li ([id number])  colonoscope was advanced from the anus through the rectum and colon to the area of the cecum, ileocecal valve and appendiceal orifice.  The cecum was deeply intubated.  These structures were well-seen and photographed for the record.  From the level of the cecum and ileocecal valve, the scope was slowly and cautiously withdrawn.  The mucosal surfaces were carefully surveyed utilizing scope tip deflection to facilitate fold flattening as needed.  The scope was pulled down into the rectum where a thorough examination including retroflexion was performed.    FINDINGS:  Adequate preparation. Normal rectum. (1) 3 mm polyp at the splenic flexure; the remainder of the colonic mucosa appeared normal. Normal distal 5 cm of terminal ileal mucosa.  THERAPEUTIC / DIAGNOSTIC MANEUVERS PERFORMED:  The above-mentioned polyps  cold snare removed and recovered for the pathologist  COMPLICATIONS: None  CECAL WITHDRAWAL TIME:  10 minutes  IMPRESSION:  Single colonic polyp-removed as described above  RECOMMENDATIONS: Followup on pathology   _______________________________ eSigned:  R. Roetta Sessions, MD FACP [facility] 12/06/2012 2:22 PM   CC:

## 2012-12-06 NOTE — H&P (Signed)
Primary Care Physician:  No primary provider on file. Primary Gastroenterologist:  Kizzie Furnish.   Pre-Procedure History & Physical: HPI:  Amanda [name] is a 57 y.o. female is here for a screening colonoscopy. No bowel symptoms. No prior colonoscopy. Younger sister in a surveillance program for colon polyps. No family history colon cancer.  Past Medical History  Diagnosis Date  . COPD (chronic obstructive pulmonary disease)   . Shortness of breath   . Asthma     Past Surgical History  Procedure Laterality Date  . Abdominal hysterectomy    . Tubal ligation      Prior to Admission medications   Medication Sig Start Date End Date Taking? Authorizing Provider  albuterol (PROVENTIL HFA;VENTOLIN HFA) 108 (90 BASE) MCG/ACT inhaler Inhale 1 puff into the lungs every 6 (six) hours as needed for wheezing or shortness of breath.   Yes Historical Provider, MD  aspirin 81 MG tablet Take 81 mg by mouth daily.   Yes Historical Provider, MD  cholecalciferol (VITAMIN D) 1000 UNITS tablet Take 1,000 Units by mouth 2 (two) times daily.    Yes Historical Provider, MD  fish oil-omega-3 fatty acids 1000 MG capsule Take 2 g by mouth daily.   Yes Historical Provider, MD  hydroxypropyl methylcellulose (ISOPTO TEARS) 2.5 % ophthalmic solution Place 1 drop into both eyes daily as needed for dry eyes.   Yes Historical Provider, MD  peg 3350 powder (MOVIPREP) 100 G SOLR Take 1 kit (200 g total) by mouth as directed. 11/24/12  Yes Corbin Ade, MD    Allergies as of 11/23/2012  . (No Known Allergies)    History reviewed. No pertinent family history.  History   Social History  . Marital Status: Legally Separated    Spouse Name: N/A    Number of Children: N/A  . Years of Education: N/A   Occupational History  . Not on file.   Social History Main Topics  . Smoking status: Never Smoker   . Smokeless tobacco: Not on file  . Alcohol Use: No  . Drug Use: No  . Sexual Activity: Yes    Birth  Control/ Protection: Surgical   Other Topics Concern  . Not on file   Social History Narrative  . No narrative on file    Review of Systems: See HPI, otherwise negative ROS  Physical Exam: BP 135/89  Pulse 88  Temp(Src) 97.7 F (36.5 C) (Oral)  Resp 18  Ht 5\' 8"  (1.727 m)  Wt 197 lb (89.359 kg)  BMI 29.96 kg/m2  SpO2 100% General:   Alert,  Well-developed, well-nourished, pleasant and cooperative in NAD Head:  Normocephalic and atraumatic. Eyes:  Sclera clear, no icterus.   Conjunctiva pink. Ears:  Normal auditory acuity. Nose:  No deformity, discharge,  or lesions. Mouth:  No deformity or lesions, dentition normal. Neck:  Supple; no masses or thyromegaly. Lungs:  Clear throughout to auscultation.   No wheezes, crackles, or rhonchi. No acute distress. Heart:  Regular rate and rhythm; no murmurs, clicks, rubs,  or gallops. Abdomen:  Soft, nontender and nondistended. No masses, hepatosplenomegaly or hernias noted. Normal bowel sounds, without guarding, and without rebound.   Msk:  Symmetrical without gross deformities. Normal posture. Pulses:  Normal pulses noted. Extremities:  Without clubbing or edema. Neurologic:  Alert and  oriented x4;  grossly normal neurologically. Skin:  Intact without significant lesions or rashes. Cervical Nodes:  No significant cervical adenopathy. Psych:  Alert and cooperative. Normal mood and affect.  Impression/Plan: Amanda [name] is now here to undergo a screening colonoscopy.  First ever screening examination.  Risks, benefits, limitations, imponderables and alternatives regarding colonoscopy have been reviewed with the patient. Questions have been answered. All parties agreeable.

## 2012-12-08 ENCOUNTER — Encounter: Payer: Self-pay | Admitting: Internal Medicine

## 2012-12-10 ENCOUNTER — Ambulatory Visit (HOSPITAL_COMMUNITY): Payer: Medicaid Other

## 2012-12-10 ENCOUNTER — Emergency Department (HOSPITAL_COMMUNITY)
Admission: EM | Admit: 2012-12-10 | Discharge: 2012-12-10 | Discharge status: Against Medical Advice | Payer: Medicaid Other | Attending: Emergency Medicine | Admitting: Emergency Medicine

## 2012-12-10 ENCOUNTER — Encounter (HOSPITAL_COMMUNITY): Payer: Self-pay | Admitting: Emergency Medicine

## 2012-12-10 DIAGNOSIS — R21 Rash and other nonspecific skin eruption: Secondary | ICD-10-CM

## 2012-12-10 DIAGNOSIS — J4489 Other specified chronic obstructive pulmonary disease: Secondary | ICD-10-CM | POA: Insufficient documentation

## 2012-12-10 DIAGNOSIS — J449 Chronic obstructive pulmonary disease, unspecified: Secondary | ICD-10-CM | POA: Insufficient documentation

## 2012-12-10 NOTE — ED Notes (Signed)
Pt reports right side of upper chest wall swelling, dizzy at times, no fever, denies any nausea or vomiting.   All started after a mammogram yesterday.

## 2012-12-10 NOTE — ED Notes (Addendum)
Patient  verbalized understanding of leaving w/o being seen by MD.  Apologized again but patient did not want to wait any longer. Discussed patient's rash and the possibility of it being shingles.  Advised patient to follow up with PCP or come to ED if symptoms worsen.

## 2012-12-10 NOTE — ED Notes (Signed)
Patient upset about the wait time.  Explained to patient the order in which patients are seen in the ED according to [facility] system.  Apologized for the wait time and encouraged patient to wait for the MD.

## 2012-12-14 ENCOUNTER — Encounter (HOSPITAL_COMMUNITY): Payer: Self-pay | Admitting: Internal Medicine

## 2012-12-20 ENCOUNTER — Encounter: Payer: Medicaid Other | Admitting: Cardiology

## 2012-12-20 ENCOUNTER — Encounter: Payer: Self-pay | Admitting: Cardiology

## 2012-12-20 DIAGNOSIS — E119 Type 2 diabetes mellitus without complications: Secondary | ICD-10-CM | POA: Insufficient documentation

## 2012-12-20 DIAGNOSIS — E782 Mixed hyperlipidemia: Secondary | ICD-10-CM | POA: Insufficient documentation

## 2012-12-20 DIAGNOSIS — R0602 Shortness of breath: Secondary | ICD-10-CM | POA: Insufficient documentation

## 2012-12-20 DIAGNOSIS — I10 Essential (primary) hypertension: Secondary | ICD-10-CM | POA: Insufficient documentation

## 2012-12-20 NOTE — Progress Notes (Signed)
Patient canceled.  This encounter was created in error - please disregard. 

## 2013-01-17 ENCOUNTER — Other Ambulatory Visit: Payer: Self-pay | Admitting: *Deleted

## 2013-01-17 ENCOUNTER — Ambulatory Visit (INDEPENDENT_AMBULATORY_CARE_PROVIDER_SITE_OTHER): Payer: Medicaid Other | Admitting: Cardiology

## 2013-01-17 ENCOUNTER — Encounter: Payer: Self-pay | Admitting: *Deleted

## 2013-01-17 ENCOUNTER — Encounter: Payer: Self-pay | Admitting: Cardiology

## 2013-01-17 VITALS — BP 126/75 | HR 94 | Ht 68.0 in | Wt 201.0 lb

## 2013-01-17 DIAGNOSIS — R0602 Shortness of breath: Secondary | ICD-10-CM

## 2013-01-17 DIAGNOSIS — E119 Type 2 diabetes mellitus without complications: Secondary | ICD-10-CM

## 2013-01-17 DIAGNOSIS — I10 Essential (primary) hypertension: Secondary | ICD-10-CM

## 2013-01-17 DIAGNOSIS — E782 Mixed hyperlipidemia: Secondary | ICD-10-CM

## 2013-01-17 NOTE — Assessment & Plan Note (Signed)
Reasonable blood pressure control today. 

## 2013-01-17 NOTE — Assessment & Plan Note (Signed)
Diet managed, followed by primary care.

## 2013-01-17 NOTE — Assessment & Plan Note (Signed)
Patient on Zocor, followed by primary care.

## 2013-01-17 NOTE — Progress Notes (Signed)
Clinical Summary Amanda [name] is a 57 y.o.female referred for cardiology consultation by Ms. Mand NP. She reports a two-year history of exertional shortness of breath, progressive in the last few months. Also has noticed a sense of fullness in her chest with this breathlessness. She does use an MDI with history of COPD, states that this is mildly helpful, although does not alleviate symptoms. She does not have rest symptoms.  No personal history of cardiac disease, tells me that her mother had a massive heart attack in her early 25s. Additional medical history is outlined below including type 2 diabetes mellitus, hyperlipidemia, and hypertension.  ECG today showed sinus tachycardia, otherwise normal. She has not undergone any prior cardiac ischemic or structural testing.  Lab work from November showed potassium 4.1, BUN 22, creatinine 0.8, normal LFTs, TSH 0.5.  No Known Allergies  Current Outpatient Prescriptions  Medication Sig Dispense Refill  . albuterol (PROVENTIL HFA;VENTOLIN HFA) 108 (90 BASE) MCG/ACT inhaler Inhale 1 puff into the lungs every 6 (six) hours as needed for wheezing or shortness of breath.      Marland Kitchen amitriptyline (ELAVIL) 25 MG tablet Take 25 mg by mouth at bedtime.      Marland Kitchen aspirin 81 MG tablet Take 81 mg by mouth daily.      . cholecalciferol (VITAMIN D) 1000 UNITS tablet Take 1,000 Units by mouth 2 (two) times daily.       Marland Kitchen estrogens, conjugated, (PREMARIN) 0.3 MG tablet Take 0.3 mg by mouth daily. Take daily for 21 days then do not take for 7 days.      . fish oil-omega-3 fatty acids 1000 MG capsule Take 2 g by mouth daily.      . hydroxypropyl methylcellulose (ISOPTO TEARS) 2.5 % ophthalmic solution Place 1 drop into both eyes daily as needed for dry eyes.      . ramipril (ALTACE) 2.5 MG capsule Take 2.5 mg by mouth daily.      . simvastatin (ZOCOR) 40 MG tablet Take 40 mg by mouth at bedtime.       No current facility-administered medications for this visit.     Past Medical History  Diagnosis Date  . COPD (chronic obstructive pulmonary disease)   . Arthralgia   . Depression   . Adult BMI 30+   . Type 2 diabetes mellitus   . Hot flashes   . Hypercholesteremia   . Essential hypertension, benign   . Obesity   . Iron deficiency anemia     Menometrorrhagia    Past Surgical History  Procedure Laterality Date  . Abdominal hysterectomy    . Tubal ligation    . Colonoscopy N/A 12/06/2012    Procedure: COLONOSCOPY;  Surgeon: Corbin Ade, MD;  Location: [facility];  Service: Endoscopy;  Laterality: N/A;  2:00 PM  . Excision pilonidal abscess      Family History  Problem Relation Age of Onset  . Breast cancer      Social History Amanda [name] reports that she has never smoked. She does not have any smokeless tobacco history on file. Amanda [name] reports that she does not drink alcohol.  Review of Systems No palpitations or syncope. Stable appetite. No bleeding problems. No claudication. She does tell me that when she was married her husband use to tell her that she "stopped breathing" at nighttime. No prior workup for OSA.  Physical Examination Filed Vitals:   01/17/13 0930  BP: 126/75  Pulse: 94   Filed Weights  01/17/13 0930  Weight: 201 lb (91.173 kg)   Patient appears comfortable at rest. HEENT: Conjunctiva and lids normal, oropharynx clear. Neck: Supple, no elevated JVP or carotid bruits, no thyromegaly. Lungs: Clear to auscultation, nonlabored breathing at rest. Cardiac: Regular rate and rhythm, no S3 or significant systolic murmur, no pericardial rub. Abdomen: Soft, nontender, bowel sounds present, no guarding or rebound. Extremities: No pitting edema, distal pulses 2+. Skin: Warm and dry. Musculoskeletal: No kyphosis. Neuropsychiatric: Alert and oriented x3, affect grossly appropriate.   Problem List and Plan   Shortness of breath Two-year history of exertional dyspnea, some "fullness" in the chest as well,  progressive in the last few months. Baseline ECG shows no acute ST segment changes or evidence of prior infarct. Her cardiac risk factors include family history, type 2 diabetes mellitus, hyperlipidemia, and hypertension. Pretest probability is at least moderate, plan will be to proceed with an echocardiogram for cardiac structural assessment and also an exercise Cardiolite for ischemic evaluation.  Essential hypertension, benign Reasonable blood pressure control today.  Mixed hyperlipidemia Patient on Zocor, followed by primary care.  Type 2 diabetes mellitus Diet managed, followed by primary care.    Jonelle Sidle, M.D., F.A.C.C.

## 2013-01-17 NOTE — Patient Instructions (Signed)
Your physician recommends that you schedule a follow-up appointment in: We will call with test results  Your physician has requested that you have en exercise stress myoview. For further information please visit [email or web address] Please follow instruction sheet, as given.  Your physician has requested that you have an echocardiogram. Echocardiography is a painless test that uses sound waves to create images of your heart. It provides your doctor with information about the size and shape of your heart and how well your heart's chambers and valves are working. This procedure takes approximately one hour. There are no restrictions for this procedure.

## 2013-01-17 NOTE — Assessment & Plan Note (Signed)
Two-year history of exertional dyspnea, some "fullness" in the chest as well, progressive in the last few months. Baseline ECG shows no acute ST segment changes or evidence of prior infarct. Her cardiac risk factors include family history, type 2 diabetes mellitus, hyperlipidemia, and hypertension. Pretest probability is at least moderate, plan will be to proceed with an echocardiogram for cardiac structural assessment and also an exercise Cardiolite for ischemic evaluation.

## 2013-01-24 ENCOUNTER — Ambulatory Visit (HOSPITAL_COMMUNITY)
Admission: RE | Admit: 2013-01-24 | Discharge: 2013-01-24 | Disposition: A | Discharge status: Home / Self Care | Payer: Medicaid Other | Source: Ambulatory Visit | Attending: Cardiology | Admitting: Cardiology

## 2013-01-24 DIAGNOSIS — R0602 Shortness of breath: Secondary | ICD-10-CM

## 2013-01-24 DIAGNOSIS — J4489 Other specified chronic obstructive pulmonary disease: Secondary | ICD-10-CM | POA: Insufficient documentation

## 2013-01-24 DIAGNOSIS — E669 Obesity, unspecified: Secondary | ICD-10-CM | POA: Insufficient documentation

## 2013-01-24 DIAGNOSIS — J449 Chronic obstructive pulmonary disease, unspecified: Secondary | ICD-10-CM | POA: Insufficient documentation

## 2013-01-24 DIAGNOSIS — R0989 Other specified symptoms and signs involving the circulatory and respiratory systems: Secondary | ICD-10-CM | POA: Insufficient documentation

## 2013-01-24 DIAGNOSIS — I517 Cardiomegaly: Secondary | ICD-10-CM

## 2013-01-24 DIAGNOSIS — I10 Essential (primary) hypertension: Secondary | ICD-10-CM | POA: Insufficient documentation

## 2013-01-24 DIAGNOSIS — Z6830 Body mass index (BMI) 30.0-30.9, adult: Secondary | ICD-10-CM | POA: Insufficient documentation

## 2013-01-24 DIAGNOSIS — R0609 Other forms of dyspnea: Secondary | ICD-10-CM | POA: Insufficient documentation

## 2013-01-24 DIAGNOSIS — E119 Type 2 diabetes mellitus without complications: Secondary | ICD-10-CM | POA: Insufficient documentation

## 2013-01-24 NOTE — Progress Notes (Signed)
*  PRELIMINARY RESULTS* Echocardiogram 2D Echocardiogram has been performed.  [place], [place] 01/24/2013, 9:18 AM

## 2013-01-28 ENCOUNTER — Encounter (HOSPITAL_COMMUNITY): Payer: Medicaid Other

## 2013-01-28 ENCOUNTER — Encounter (HOSPITAL_COMMUNITY): Admission: RE | Admit: 2013-01-28 | Payer: Medicaid Other | Source: Ambulatory Visit

## 2013-02-03 ENCOUNTER — Encounter (HOSPITAL_COMMUNITY): Payer: Medicaid Other

## 2013-02-09 ENCOUNTER — Encounter (HOSPITAL_COMMUNITY): Payer: Self-pay

## 2013-02-09 ENCOUNTER — Encounter (HOSPITAL_COMMUNITY)
Admission: RE | Admit: 2013-02-09 | Discharge: 2013-02-09 | Disposition: A | Discharge status: Home / Self Care | Payer: Medicaid Other | Source: Ambulatory Visit | Attending: Cardiology | Admitting: Cardiology

## 2013-02-09 DIAGNOSIS — R0602 Shortness of breath: Secondary | ICD-10-CM

## 2013-02-09 DIAGNOSIS — R0989 Other specified symptoms and signs involving the circulatory and respiratory systems: Secondary | ICD-10-CM

## 2013-02-09 DIAGNOSIS — R0609 Other forms of dyspnea: Secondary | ICD-10-CM

## 2013-02-09 MED ORDER — SODIUM CHLORIDE 0.9 % IJ SOLN
INTRAMUSCULAR | Status: AC
  Administered 2013-02-09: 10 mL via INTRAVENOUS
  Filled 2013-02-09: qty 10

## 2013-02-09 MED ORDER — TECHNETIUM TC 99M SESTAMIBI - CARDIOLITE
30.0000 | Freq: Once | INTRAVENOUS | Status: AC | PRN
  Administered 2013-02-09: 11:00:00 30 via INTRAVENOUS

## 2013-02-09 MED ORDER — TECHNETIUM TC 99M SESTAMIBI GENERIC - CARDIOLITE
10.0000 | Freq: Once | INTRAVENOUS | Status: AC | PRN
  Administered 2013-02-09: 10 via INTRAVENOUS

## 2013-02-09 NOTE — Progress Notes (Signed)
Stress Lab Nurses Notes - [facility]  Veora CHENEE MUNNS 02/09/2013 Reason for doing test: Dyspnea Type of test: Stress Cardiolite Nurse performing test: Gerrit Halls, RN Nuclear Medicine Tech: Dyanne Carrel Echo Tech: Not Applicable MD performing test: S. McDowell/K.Lawrence NP Family MD: NPCP Test explained and consent signed: yes IV started: 22g jelco, Saline lock flushed, No redness or edema and Saline lock started in radiology Symptoms: SOB & Fatigue Treatment/Intervention: None Reason test stopped: fatigue and reached target HR After recovery IV was: Discontinued via X-ray tech and No redness or edema Patient to return to [place]. Med at : 11:45 Patient discharged: Home Patient's Condition upon discharge was: stable Comments: During test peak BP 184/87 & HR 150.  Recovery BP 143/88 & HR 96.  Symptoms resolved in recovery. Geanie Cooley T

## 2014-04-12 IMAGING — NM NM MYOCAR SINGLE W/SPECT W/WALL MOTION & EF
1 series · 6 of 6 positions shown · non-contrast
Comparison: None.

CLINICAL DATA: 57-year-old woman with a history of COPD, type 2
diabetes mellitus, hypertension, hyperlipidemia. She is referred
with history of dyspnea on exertion for the evaluation of ischemia.

EXAM:
MYOCARDIAL IMAGING WITH SPECT (REST AND EXERCISE)
GATED LEFT VENTRICULAR WALL MOTION STUDY
LEFT VENTRICULAR EJECTION FRACTION
TECHNIQUE: Standard myocardial SPECT imaging was performed after resting
intravenous injection of 10 mCi Ic-XXm sestamibi. Subsequently,
exercise tolerance test was performed by the patient under the
supervision of the Cardiology staff. At peak-stress, 30 mCi Ic-XXm
sestamibi was injected intravenously and standard myocardial SPECT
imaging was performed. Quantitative gated imaging was also performed
to evaluate left ventricular wall motion, and estimate left
ventricular ejection fraction.

[cr cardiac tc low dose · 6.41mm/px · 6 of 64 frames shown]
[frame 6/64]
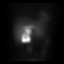
[frame 16/64]
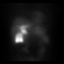
[frame 27/64]
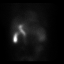
[frame 38/64]
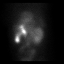
[frame 48/64]
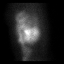
[frame 59/64]
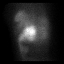

[6 of 6 positions shown; findings below may reference images not displayed]

FINDINGS: Baseline tracing shows sinus rhythm at 96 beats per min with
decreased R wave progression. Patient was exercised on a Bruce
protocol for 4 min and 8 seconds achieving a maximum work load of 7
METS. Heart rate increased to 150 beats per min which was 93% of the
maximal age predicted heart rate. Blood pressure increased to
184/87. No chest pain was reported. Lead motion artifact noted. No
clearly abnormal ST segment changes or arrhythmias were noted.

Analysis of the overall perfusion data finds a small point of
radiotracer extravasation in the region of the right lateral mid
abdomen, presumably on a garment. Cardiac radiotracer uptake is
adequate.

Tomographic views were obtained using the short axis, vertical long
axis, and horizontal long axis planes. No significant perfusion
defects are noted at stress.

Gated imaging reveals an EDV of 62, ESV of 20, TID ratio 0.7, and
LVEF of 68% without wall motion abnormalities.
IMPRESSION: Low risk exercise Cardiolite. There were no diagnostic ST segment
changes at a maximum work load of 7 METS. No chest pain was
reported. No significant perfusion defects to indicate scar or
ischemia. LV volumes are normal with LVEF 68% and no wall motion
abnormalities.

## 2015-06-10 ENCOUNTER — Encounter (HOSPITAL_COMMUNITY): Payer: Self-pay | Admitting: Emergency Medicine

## 2015-06-10 ENCOUNTER — Emergency Department (HOSPITAL_COMMUNITY)
Admission: EM | Admit: 2015-06-10 | Discharge: 2015-06-10 | Disposition: A | Discharge status: Home / Self Care | Payer: Medicaid Other | Attending: Dermatology | Admitting: Dermatology

## 2015-06-10 DIAGNOSIS — I10 Essential (primary) hypertension: Secondary | ICD-10-CM | POA: Insufficient documentation

## 2015-06-10 DIAGNOSIS — E119 Type 2 diabetes mellitus without complications: Secondary | ICD-10-CM | POA: Insufficient documentation

## 2015-06-10 DIAGNOSIS — R109 Unspecified abdominal pain: Secondary | ICD-10-CM | POA: Insufficient documentation

## 2015-06-10 DIAGNOSIS — Z7982 Long term (current) use of aspirin: Secondary | ICD-10-CM | POA: Insufficient documentation

## 2015-06-10 DIAGNOSIS — Z7984 Long term (current) use of oral hypoglycemic drugs: Secondary | ICD-10-CM | POA: Insufficient documentation

## 2015-06-10 DIAGNOSIS — Z5321 Procedure and treatment not carried out due to patient leaving prior to being seen by health care provider: Secondary | ICD-10-CM | POA: Insufficient documentation

## 2015-06-10 DIAGNOSIS — F329 Major depressive disorder, single episode, unspecified: Secondary | ICD-10-CM | POA: Insufficient documentation

## 2015-06-10 DIAGNOSIS — J449 Chronic obstructive pulmonary disease, unspecified: Secondary | ICD-10-CM | POA: Insufficient documentation

## 2015-06-10 NOTE — ED Notes (Addendum)
Pt states she has been having pain in her left ribs for over a week.  States it jabs like a knife and is not constant.  Has been exposed to shingles multiple times as her mother has active infection and pt has been caring for her.

## 2015-06-10 NOTE — ED Notes (Signed)
Pt advised she is leaving at this time.

## 2015-08-07 ENCOUNTER — Telehealth (HOSPITAL_COMMUNITY): Payer: Self-pay | Admitting: *Deleted

## 2015-08-07 NOTE — Telephone Encounter (Signed)
left voice message at [phone], regarding an appointment.

## 2015-10-22 ENCOUNTER — Ambulatory Visit (HOSPITAL_COMMUNITY): Payer: Self-pay | Admitting: Psychiatry

## 2016-05-07 ENCOUNTER — Encounter: Payer: Self-pay | Admitting: Internal Medicine

## 2016-05-19 ENCOUNTER — Ambulatory Visit (INDEPENDENT_AMBULATORY_CARE_PROVIDER_SITE_OTHER): Payer: BLUE CROSS/BLUE SHIELD | Admitting: Nurse Practitioner

## 2016-05-19 ENCOUNTER — Other Ambulatory Visit: Payer: Self-pay

## 2016-05-19 ENCOUNTER — Encounter: Payer: Self-pay | Admitting: Nurse Practitioner

## 2016-05-19 DIAGNOSIS — K219 Gastro-esophageal reflux disease without esophagitis: Secondary | ICD-10-CM

## 2016-05-19 DIAGNOSIS — A048 Other specified bacterial intestinal infections: Secondary | ICD-10-CM

## 2016-05-19 DIAGNOSIS — R1310 Dysphagia, unspecified: Secondary | ICD-10-CM

## 2016-05-19 MED ORDER — OMEPRAZOLE 40 MG PO CPDR: 40.0000 mg | DELAYED_RELEASE_CAPSULE | Freq: Every day | ORAL | 3 refills | Status: DC

## 2016-05-19 NOTE — Assessment & Plan Note (Signed)
H. pylori infection presumed based on serologies. She was given an antibiotic and thinks it was Pylera. I will confirm this with primary care office. She just completed treatment. Recommend she continue PPI until her upper endoscopy. We can check gastric pinch biopsies to confirm eradication. Return for follow-up in 3 months.

## 2016-05-19 NOTE — Progress Notes (Signed)
Primary Care Physician:  Yevette Edwards, NP Primary Gastroenterologist:  Dr. Gala Romney  Chief Complaint  Patient presents with  . Abdominal Pain  . Gastroesophageal Reflux    HPI:   Amanda [name] is a 61 y.o. female who presents On referral from primary care for abdominal pain and reflux. PCP notes reviewed. The patient last saw primary care 05/06/2016 at which point she complained of nausea which is intermittent but ongoing for the previous month. Also noted abdominal pain in the epigastric area described as burning which also started a month prior. She was prescribed omeprazole 40 mg once a day. Labs reviewed that were included with primary care notes including CBC which was essentially normal, seen at which was also essentially normal, H. pylori IgG serologies noted to be positive, lipase normal, amylase mildly elevated at 129 with a high normal of 124.  Today she states she's doing ok overall. States she did have epigastric pain and GERD symptoms. Was taking Omeprazole but "I finished those." This started about a month ago and at the start had a lot of nausea. Feels like there's something stuck in her throat. Does have what seems to be a sensation of solid food dysphagia, no pill dysphagia. Symptoms seems to be "random." Also with burning pain in her lower abdomen which started at the same time as her other symptoms. Does have some constipation, has a bowel movement daily since starting antibiotics. Thinks it's Pylera, but not sure. Lower abdominal burning seems better since starting Pylera. Denies hematochezia, melena, fever, chills, unintentional weight loss.  Denies chest pain, dyspnea, dizziness, lightheadedness, syncope, near syncope. Denies any other upper or lower GI symptoms.  Past Medical History:  Diagnosis Date  . Adult BMI 30+   . Arthralgia   . COPD (chronic obstructive pulmonary disease) ([place])   . Depression   . Essential hypertension, benign   . Hot flashes   .  Hypercholesteremia   . Iron deficiency anemia    Menometrorrhagia  . Obesity   . Type 2 diabetes mellitus ([place])     Past Surgical History:  Procedure Laterality Date  . ABDOMINAL HYSTERECTOMY    . COLONOSCOPY N/A 12/06/2012   Procedure: COLONOSCOPY;  Surgeon: Daneil Dolin, MD;  Location: [facility];  Service: Endoscopy;  Laterality: N/A;  2:00 PM  . Excision pilonidal abscess    . TUBAL LIGATION      Current Outpatient Prescriptions  Medication Sig Dispense Refill  . albuterol (PROVENTIL HFA;VENTOLIN HFA) 108 (90 BASE) MCG/ACT inhaler Inhale 1 puff into the lungs every 6 (six) hours as needed for wheezing or shortness of breath.    Marland Kitchen amitriptyline (ELAVIL) 25 MG tablet Take 25 mg by mouth at bedtime.    Marland Kitchen aspirin 81 MG tablet Take 81 mg by mouth daily.    Marland Kitchen estrogens, conjugated, (PREMARIN) 0.3 MG tablet Take 0.3 mg by mouth daily. Take daily for 21 days then do not take for 7 days.    . fish oil-omega-3 fatty acids 1000 MG capsule Take 2 g by mouth daily.    . hydroxypropyl methylcellulose (ISOPTO TEARS) 2.5 % ophthalmic solution Place 1 drop into both eyes daily as needed for dry eyes.    Marland Kitchen loratadine (CLARITIN) 10 MG tablet Take 10 mg by mouth daily.    . ramipril (ALTACE) 2.5 MG capsule Take 2.5 mg by mouth daily.    . simvastatin (ZOCOR) 40 MG tablet Take 40 mg by mouth at bedtime.    Marland Kitchen  omeprazole (PRILOSEC) 40 MG capsule Take 1 capsule (40 mg total) by mouth daily. 30 capsule 3   No current facility-administered medications for this visit.     Allergies as of 05/19/2016  . (No Known Allergies)    Family History  Problem Relation Age of Onset  . Breast cancer    . Colon cancer Brother 39    Social History   Social History  . Marital status: Legally Separated    Spouse name: N/A  . Number of children: N/A  . Years of education: N/A   Occupational History  . Not on file.   Social History Main Topics  . Smoking status: Never Smoker  . Smokeless tobacco:  Never Used  . Alcohol use No  . Drug use: No  . Sexual activity: Yes    Birth control/ protection: Surgical   Other Topics Concern  . Not on file   Social History Narrative  . No narrative on file    Review of Systems: General: Negative for anorexia, weight loss, fever, chills, fatigue, weakness. ENT: Negative for hoarseness, difficulty swallowing. CV: Negative for chest pain, angina, palpitations, peripheral edema.  Respiratory: Negative for dyspnea at rest, cough, sputum, wheezing.  GI: See history of present illness. MS: Negative for joint pain, low back pain.  Derm: Negative for rash or itching.  Endo: Negative for unusual weight change.  Heme: Negative for bruising or bleeding. Allergy: Negative for rash or hives.    Physical Exam: BP (!) 142/88   Pulse 76   Temp 97.6 F (36.4 C) (Oral)   Ht 5\' 8"  (1.727 m)   Wt 188 lb 9.6 oz (85.5 kg)   BMI 28.68 kg/m  General:   Alert and oriented. Pleasant and cooperative. Well-nourished and well-developed.  Head:  Normocephalic and atraumatic. Eyes:  Without icterus, sclera clear and conjunctiva pink.  Ears:  Normal auditory acuity. Cardiovascular:  S1, S2 present without murmurs appreciated. Extremities without clubbing or edema. Respiratory:  Clear to auscultation bilaterally. No wheezes, rales, or rhonchi. No distress.  Gastrointestinal:  +BS, soft, non-tender and non-distended. No HSM noted. No guarding or rebound. No masses appreciated.  Rectal:  Deferred  Musculoskalatal:  Symmetrical without gross deformities. Neurologic:  Alert and oriented x4;  grossly normal neurologically. Psych:  Alert and cooperative. Normal mood and affect. Heme/Lymph/Immune: No excessive bruising noted.    05/19/2016 9:01 AM   Disclaimer: This note was dictated with voice recognition software. Similar sounding words can inadvertently be transcribed and may not be corrected upon review.

## 2016-05-19 NOTE — Assessment & Plan Note (Signed)
The patient describes what essentially sounds like solid food dysphasia. No pill dysphagia noted. Has a sensation of something being stuck in her throat and her food is difficult to pass and also feels like it gets stuck. She continues to have these symptoms even after Pylera and being on omeprazole. At this point we will proceed with upper endoscopy to further evaluate dysphagia symptoms. She may need dilation. This will also allow [place] to perform gastric biopsy to confirm eradication. Return for follow-up in 3 months.  The patient is currently on Elavil, trazodone for sleep. No other anticoagulants, anxiolytics, chronic pain medications, or antidepressants. She has just started the trazodone. I will add 12.5 mg preprocedure Phenergan to promote adequate sedation. Her last procedure was completed on conscious sedation only.

## 2016-05-19 NOTE — Progress Notes (Signed)
CC'ED TO PCP 

## 2016-05-19 NOTE — Assessment & Plan Note (Signed)
Current symptoms began about 1 month ago and were essentially a sudden onset. She was given Prilosec 40 mg once daily. She was also given antibiotics for H. pylori, as per below. Her GERD symptoms have improved. She does have some dysphagia as per below. Further workup of dysphagia noted below. Return for follow-up in 3 months.

## 2016-05-19 NOTE — Patient Instructions (Signed)
1. I refilled your omeprazole 40 mg prescription. 2. We will call your primary care to find out which antibiotic you were given. 3. We will schedule your upper endoscopy for you. 4. Return for follow-up in 3 months. 5. Call if any severe or worsening symptoms before then.

## 2016-06-02 ENCOUNTER — Telehealth: Payer: Self-pay

## 2016-06-02 NOTE — Telephone Encounter (Signed)
Tried to call pt again. Didn't ring, went straight to voicemail box that hasn't been set-up.

## 2016-06-02 NOTE — Telephone Encounter (Signed)
Tried to call pt for her to arrive an hour earlier for egd/+/-dil 06/05/16. No answer, unable to leave voicemail d/t mailbox hasn't been set-up.

## 2016-06-03 NOTE — Telephone Encounter (Signed)
Called and informed pt to arrive 06/05/16 at 12:15pm for 1:15 procedure. Advised her she may have clear liquids until 9:00am 06/05/16 and NPO after 9:00am.

## 2016-06-05 ENCOUNTER — Encounter (HOSPITAL_COMMUNITY)
Admission: RE | Disposition: A | Discharge status: Home / Self Care | Payer: Self-pay | Source: Ambulatory Visit | Attending: Internal Medicine

## 2016-06-05 ENCOUNTER — Encounter (HOSPITAL_COMMUNITY): Payer: Self-pay

## 2016-06-05 ENCOUNTER — Ambulatory Visit (HOSPITAL_COMMUNITY)
Admission: RE | Admit: 2016-06-05 | Discharge: 2016-06-05 | Disposition: A | Discharge status: Home / Self Care | Payer: BLUE CROSS/BLUE SHIELD | Source: Ambulatory Visit | Attending: Internal Medicine | Admitting: Internal Medicine

## 2016-06-05 DIAGNOSIS — Z7982 Long term (current) use of aspirin: Secondary | ICD-10-CM | POA: Insufficient documentation

## 2016-06-05 DIAGNOSIS — K219 Gastro-esophageal reflux disease without esophagitis: Secondary | ICD-10-CM | POA: Insufficient documentation

## 2016-06-05 DIAGNOSIS — K449 Diaphragmatic hernia without obstruction or gangrene: Secondary | ICD-10-CM | POA: Insufficient documentation

## 2016-06-05 DIAGNOSIS — R1310 Dysphagia, unspecified: Secondary | ICD-10-CM

## 2016-06-05 DIAGNOSIS — A048 Other specified bacterial intestinal infections: Secondary | ICD-10-CM

## 2016-06-05 DIAGNOSIS — K3189 Other diseases of stomach and duodenum: Secondary | ICD-10-CM | POA: Diagnosis not present

## 2016-06-05 DIAGNOSIS — E669 Obesity, unspecified: Secondary | ICD-10-CM | POA: Diagnosis not present

## 2016-06-05 DIAGNOSIS — J449 Chronic obstructive pulmonary disease, unspecified: Secondary | ICD-10-CM | POA: Insufficient documentation

## 2016-06-05 DIAGNOSIS — F329 Major depressive disorder, single episode, unspecified: Secondary | ICD-10-CM | POA: Insufficient documentation

## 2016-06-05 DIAGNOSIS — K269 Duodenal ulcer, unspecified as acute or chronic, without hemorrhage or perforation: Secondary | ICD-10-CM | POA: Diagnosis not present

## 2016-06-05 DIAGNOSIS — E119 Type 2 diabetes mellitus without complications: Secondary | ICD-10-CM | POA: Insufficient documentation

## 2016-06-05 DIAGNOSIS — K2950 Unspecified chronic gastritis without bleeding: Secondary | ICD-10-CM | POA: Diagnosis not present

## 2016-06-05 DIAGNOSIS — Z6827 Body mass index (BMI) 27.0-27.9, adult: Secondary | ICD-10-CM | POA: Insufficient documentation

## 2016-06-05 DIAGNOSIS — Z79899 Other long term (current) drug therapy: Secondary | ICD-10-CM | POA: Diagnosis not present

## 2016-06-05 DIAGNOSIS — Z79890 Hormone replacement therapy (postmenopausal): Secondary | ICD-10-CM | POA: Insufficient documentation

## 2016-06-05 DIAGNOSIS — E7800 Pure hypercholesterolemia, unspecified: Secondary | ICD-10-CM | POA: Insufficient documentation

## 2016-06-05 DIAGNOSIS — R1013 Epigastric pain: Secondary | ICD-10-CM | POA: Insufficient documentation

## 2016-06-05 DIAGNOSIS — I10 Essential (primary) hypertension: Secondary | ICD-10-CM | POA: Insufficient documentation

## 2016-06-05 HISTORY — PX: BIOPSY: SHX5522

## 2016-06-05 HISTORY — PX: MALONEY DILATION: SHX5535

## 2016-06-05 HISTORY — PX: ESOPHAGOGASTRODUODENOSCOPY: SHX5428

## 2016-06-05 SURGERY — EGD (ESOPHAGOGASTRODUODENOSCOPY)
Anesthesia: Moderate Sedation

## 2016-06-05 MED ORDER — SODIUM CHLORIDE 0.9% FLUSH
INTRAVENOUS | Status: AC
  Filled 2016-06-05: qty 10

## 2016-06-05 MED ORDER — SODIUM CHLORIDE 0.9 % IV SOLN
INTRAVENOUS | Status: DC
  Administered 2016-06-05: 13:00:00 via INTRAVENOUS

## 2016-06-05 MED ORDER — ONDANSETRON HCL 4 MG/2ML IJ SOLN
INTRAMUSCULAR | Status: AC
  Filled 2016-06-05: qty 2

## 2016-06-05 MED ORDER — MEPERIDINE HCL 100 MG/ML IJ SOLN
INTRAMUSCULAR | Status: AC
  Filled 2016-06-05: qty 2

## 2016-06-05 MED ORDER — MEPERIDINE HCL 100 MG/ML IJ SOLN
INTRAMUSCULAR | Status: DC | PRN
  Administered 2016-06-05: 50 mg via INTRAVENOUS

## 2016-06-05 MED ORDER — MIDAZOLAM HCL 5 MG/5ML IJ SOLN
INTRAMUSCULAR | Status: AC
  Filled 2016-06-05: qty 10

## 2016-06-05 MED ORDER — ONDANSETRON HCL 4 MG/2ML IJ SOLN
INTRAMUSCULAR | Status: DC | PRN
  Administered 2016-06-05: 4 mg via INTRAVENOUS

## 2016-06-05 MED ORDER — LIDOCAINE VISCOUS 2 % MT SOLN
OROMUCOSAL | Status: DC | PRN
  Administered 2016-06-05: 3 mL via OROMUCOSAL

## 2016-06-05 MED ORDER — PROMETHAZINE HCL 25 MG/ML IJ SOLN
INTRAMUSCULAR | Status: AC
  Administered 2016-06-05: 12.5 mg via INTRAVENOUS
  Filled 2016-06-05: qty 1

## 2016-06-05 MED ORDER — LIDOCAINE VISCOUS 2 % MT SOLN
OROMUCOSAL | Status: AC
  Filled 2016-06-05: qty 15

## 2016-06-05 MED ORDER — PROMETHAZINE HCL 25 MG/ML IJ SOLN
12.5000 mg | Freq: Once | INTRAMUSCULAR | Status: AC
  Administered 2016-06-05: 12.5 mg via INTRAVENOUS

## 2016-06-05 MED ORDER — MIDAZOLAM HCL 5 MG/5ML IJ SOLN
INTRAMUSCULAR | Status: DC | PRN
  Administered 2016-06-05: 2 mg via INTRAVENOUS
  Administered 2016-06-05: 1 mg via INTRAVENOUS

## 2016-06-05 MED ORDER — STERILE WATER FOR IRRIGATION IR SOLN
Status: DC | PRN
  Administered 2016-06-05: 14:00:00

## 2016-06-05 NOTE — H&P (View-Only) (Signed)
Primary Care Physician:  Yevette Edwards, NP Primary Gastroenterologist:  Dr. Gala Romney  Chief Complaint  Patient presents with  . Abdominal Pain  . Gastroesophageal Reflux    HPI:   Amanda [name] is a 60 y.o. female who presents On referral from primary care for abdominal pain and reflux. PCP notes reviewed. The patient last saw primary care 05/06/2016 at which point she complained of nausea which is intermittent but ongoing for the previous month. Also noted abdominal pain in the epigastric area described as burning which also started a month prior. She was prescribed omeprazole 40 mg once a day. Labs reviewed that were included with primary care notes including CBC which was essentially normal, seen at which was also essentially normal, H. pylori IgG serologies noted to be positive, lipase normal, amylase mildly elevated at 129 with a high normal of 124.  Today she states she's doing ok overall. States she did have epigastric pain and GERD symptoms. Was taking Omeprazole but "I finished those." This started about a month ago and at the start had a lot of nausea. Feels like there's something stuck in her throat. Does have what seems to be a sensation of solid food dysphagia, no pill dysphagia. Symptoms seems to be "random." Also with burning pain in her lower abdomen which started at the same time as her other symptoms. Does have some constipation, has a bowel movement daily since starting antibiotics. Thinks it's Pylera, but not sure. Lower abdominal burning seems better since starting Pylera. Denies hematochezia, melena, fever, chills, unintentional weight loss.  Denies chest pain, dyspnea, dizziness, lightheadedness, syncope, near syncope. Denies any other upper or lower GI symptoms.  Past Medical History:  Diagnosis Date  . Adult BMI 30+   . Arthralgia   . COPD (chronic obstructive pulmonary disease) ([place])   . Depression   . Essential hypertension, benign   . Hot flashes   .  Hypercholesteremia   . Iron deficiency anemia    Menometrorrhagia  . Obesity   . Type 2 diabetes mellitus ([place])     Past Surgical History:  Procedure Laterality Date  . ABDOMINAL HYSTERECTOMY    . COLONOSCOPY N/A 12/06/2012   Procedure: COLONOSCOPY;  Surgeon: Daneil Dolin, MD;  Location: [facility];  Service: Endoscopy;  Laterality: N/A;  2:00 PM  . Excision pilonidal abscess    . TUBAL LIGATION      Current Outpatient Prescriptions  Medication Sig Dispense Refill  . albuterol (PROVENTIL HFA;VENTOLIN HFA) 108 (90 BASE) MCG/ACT inhaler Inhale 1 puff into the lungs every 6 (six) hours as needed for wheezing or shortness of breath.    Marland Kitchen amitriptyline (ELAVIL) 25 MG tablet Take 25 mg by mouth at bedtime.    Marland Kitchen aspirin 81 MG tablet Take 81 mg by mouth daily.    Marland Kitchen estrogens, conjugated, (PREMARIN) 0.3 MG tablet Take 0.3 mg by mouth daily. Take daily for 21 days then do not take for 7 days.    . fish oil-omega-3 fatty acids 1000 MG capsule Take 2 g by mouth daily.    . hydroxypropyl methylcellulose (ISOPTO TEARS) 2.5 % ophthalmic solution Place 1 drop into both eyes daily as needed for dry eyes.    Marland Kitchen loratadine (CLARITIN) 10 MG tablet Take 10 mg by mouth daily.    . ramipril (ALTACE) 2.5 MG capsule Take 2.5 mg by mouth daily.    . simvastatin (ZOCOR) 40 MG tablet Take 40 mg by mouth at bedtime.    Marland Kitchen  omeprazole (PRILOSEC) 40 MG capsule Take 1 capsule (40 mg total) by mouth daily. 30 capsule 3   No current facility-administered medications for this visit.     Allergies as of 05/19/2016  . (No Known Allergies)    Family History  Problem Relation Age of Onset  . Breast cancer    . Colon cancer Brother 72    Social History   Social History  . Marital status: Legally Separated    Spouse name: N/A  . Number of children: N/A  . Years of education: N/A   Occupational History  . Not on file.   Social History Main Topics  . Smoking status: Never Smoker  . Smokeless tobacco:  Never Used  . Alcohol use No  . Drug use: No  . Sexual activity: Yes    Birth control/ protection: Surgical   Other Topics Concern  . Not on file   Social History Narrative  . No narrative on file    Review of Systems: General: Negative for anorexia, weight loss, fever, chills, fatigue, weakness. ENT: Negative for hoarseness, difficulty swallowing. CV: Negative for chest pain, angina, palpitations, peripheral edema.  Respiratory: Negative for dyspnea at rest, cough, sputum, wheezing.  GI: See history of present illness. MS: Negative for joint pain, low back pain.  Derm: Negative for rash or itching.  Endo: Negative for unusual weight change.  Heme: Negative for bruising or bleeding. Allergy: Negative for rash or hives.    Physical Exam: BP (!) 142/88   Pulse 76   Temp 97.6 F (36.4 C) (Oral)   Ht 5\' 8"  (1.727 m)   Wt 188 lb 9.6 oz (85.5 kg)   BMI 28.68 kg/m  General:   Alert and oriented. Pleasant and cooperative. Well-nourished and well-developed.  Head:  Normocephalic and atraumatic. Eyes:  Without icterus, sclera clear and conjunctiva pink.  Ears:  Normal auditory acuity. Cardiovascular:  S1, S2 present without murmurs appreciated. Extremities without clubbing or edema. Respiratory:  Clear to auscultation bilaterally. No wheezes, rales, or rhonchi. No distress.  Gastrointestinal:  +BS, soft, non-tender and non-distended. No HSM noted. No guarding or rebound. No masses appreciated.  Rectal:  Deferred  Musculoskalatal:  Symmetrical without gross deformities. Neurologic:  Alert and oriented x4;  grossly normal neurologically. Psych:  Alert and cooperative. Normal mood and affect. Heme/Lymph/Immune: No excessive bruising noted.    05/19/2016 9:01 AM   Disclaimer: This note was dictated with voice recognition software. Similar sounding words can inadvertently be transcribed and may not be corrected upon review.

## 2016-06-05 NOTE — Discharge Instructions (Addendum)
EGD Discharge instructions Please read the instructions outlined below and refer to this sheet in the next few weeks. These discharge instructions provide you with general information on caring for yourself after you leave the hospital. Your doctor may also give you specific instructions. While your treatment has been planned according to the most current medical practices available, unavoidable complications occasionally occur. If you have any problems or questions after discharge, please call your doctor. ACTIVITY  You may resume your regular activity but move at a slower pace for the next 24 hours.   Take frequent rest periods for the next 24 hours.   Walking will help expel (get rid of) the air and reduce the bloated feeling in your abdomen.   No driving for 24 hours (because of the anesthesia (medicine) used during the test).   You may shower.   Do not sign any important legal documents or operate any machinery for 24 hours (because of the anesthesia used during the test).  NUTRITION  Drink plenty of fluids.   You may resume your normal diet.   Begin with a light meal and progress to your normal diet.   Avoid alcoholic beverages for 24 hours or as instructed by your caregiver.  MEDICATIONS  You may resume your normal medications unless your caregiver tells you otherwise.  WHAT YOU CAN EXPECT TODAY  You may experience abdominal discomfort such as a feeling of fullness or gas pains.  FOLLOW-UP  Your doctor will discuss the results of your test with you.  SEEK IMMEDIATE MEDICAL ATTENTION IF ANY OF THE FOLLOWING OCCUR:  Excessive nausea (feeling sick to your stomach) and/or vomiting.   Severe abdominal pain and distention (swelling).   Trouble swallowing.   Temperature over 101 F (37.8 C).   Rectal bleeding or vomiting of blood.    Continue omeprazole 40 mg daily  Further recommendations to follow pending review of pathology report from biopsies taken  today.  Follow up office appointment in one month    Hiatal Hernia A hiatal hernia occurs when part of the stomach slides above the muscle that separates the abdomen from the chest (diaphragm). A person can be born with a hiatal hernia (congenital), or it may develop over time. In almost all cases of hiatal hernia, only the top part of the stomach pushes through the diaphragm. Many people have a hiatal hernia with no symptoms. The larger the hernia, the more likely it is that you will have symptoms. In some cases, a hiatal hernia allows stomach acid to flow back into the tube that carries food from your mouth to your stomach (esophagus). This may cause heartburn symptoms. Severe heartburn symptoms may mean that you have developed a condition called gastroesophageal reflux disease (GERD). What are the causes? This condition is caused by a weakness in the opening (hiatus) where the esophagus passes through the diaphragm to attach to the upper part of the stomach. A person may be born with a weakness in the hiatus, or a weakness can develop over time. What increases the risk? This condition is more likely to develop in:  Older people. Age is a major risk factor for a hiatal hernia, especially if you are over the age of 77.  Pregnant women.  People who are overweight.  People who have frequent constipation. What are the signs or symptoms? Symptoms of this condition usually develop in the form of GERD symptoms. Symptoms include:  Heartburn.  Belching.  Indigestion.  Trouble swallowing.  Coughing or  wheezing.  Sore throat.  Hoarseness.  Chest pain.  Nausea and vomiting. How is this diagnosed? This condition may be diagnosed during testing for GERD. Tests that may be done include:  X-rays of your stomach or chest.  An upper gastrointestinal (GI) series. This is an X-ray exam of your GI tract that is taken after you swallow a chalky liquid that shows up clearly on the  X-ray.  Endoscopy. This is a procedure to look into your stomach using a thin, flexible tube that has a tiny camera and light on the end of it. How is this treated? This condition may be treated by:  Dietary and lifestyle changes to help reduce GERD symptoms.  Medicines. These may include:  Over-the-counter antacids.  Medicines that make your stomach empty more quickly.  Medicines that block the production of stomach acid (H2 blockers).  Stronger medicines to reduce stomach acid (proton pump inhibitors).  Surgery to repair the hernia, if other treatments are not helping. If you have no symptoms, you may not need treatment. Follow these instructions at home: Lifestyle and activity   Do not use any products that contain nicotine or tobacco, such as cigarettes and e-cigarettes. If you need help quitting, ask your health care provider.  Try to achieve and maintain a healthy body weight.  Avoid putting pressure on your abdomen. Anything that puts pressure on your abdomen increases the amount of acid that may be pushed up into your esophagus.  Avoid bending over, especially after eating.  Raise the head of your bed by putting blocks under the legs. This keeps your head and esophagus higher than your stomach.  Do not wear tight clothing around your chest or stomach.  Try not to strain when having a bowel movement, when urinating, or when lifting heavy objects. Eating and drinking   Avoid foods that can worsen GERD symptoms. These may include:  Fatty foods, like fried foods.  Citrus fruits, like oranges or lemon.  Other foods and drinks that contain acid, like orange juice or tomatoes.  Spicy food.  Chocolate.  Eat frequent small meals instead of three large meals a day. This helps prevent your stomach from getting too full.  Eat slowly.  Do not lie down right after eating.  Do not eat 1-2 hours before bed.  Do not drink beverages with caffeine. These include cola,  coffee, cocoa, and tea.  Do not drink alcohol. General instructions   Take over-the-counter and prescription medicines only as told by your health care provider.  Keep all follow-up visits as told by your health care provider. This is important. Contact a health care provider if:  Your symptoms are not controlled with medicines or lifestyle changes.  You are having trouble swallowing.  You have coughing or wheezing that will not go away. Get help right away if:  Your pain is getting worse.  Your pain spreads to your arms, neck, jaw, teeth, or back.  You have shortness of breath.  You sweat for no reason.  You feel sick to your stomach (nauseous) or you vomit.  You vomit blood.  You have bright red blood in your stools.  You have black, tarry stools. This information is not intended to replace advice given to you by your health care provider. Make sure you discuss any questions you have with your health care provider. Document Released: 03/29/2003 Document Revised: 12/31/2015 Document Reviewed: 12/31/2015 Elsevier Interactive Patient Education  2017 [employer].

## 2016-06-05 NOTE — Op Note (Signed)
[facility] Patient Name: Amanda [name] Procedure Date: 06/05/2016 1:14 PM MRN: [id number] Date of Birth: 06/04/1955 Attending MD: Norvel Richards , MD CSN: [id number] Age: 61 Admit Type: Outpatient Procedure:                Upper GI endoscopy Indications:              Epigastric abdominal pain Providers:                Norvel Richards, MD, Otis Peak B. Sharon Seller, RN,                            Randa Spike, Technician Referring MD:             Ronne Binning. Mand Medicines:                Midazolam 3 mg IV, Meperidine 50 mg IV, Ondansetron                            4 mg IV, Promethazine 38.2 mg IV Complications:            No immediate complications. Estimated Blood Loss:     Estimated blood loss was minimal. Procedure:                Pre-Anesthesia Assessment:                           - Prior to the procedure, a History and Physical                            was performed, and patient medications and                            allergies were reviewed. The patient's tolerance of                            previous anesthesia was also reviewed. The risks                            and benefits of the procedure and the sedation                            options and risks were discussed with the patient.                            All questions were answered, and informed consent                            was obtained. Prior Anticoagulants: The patient has                            taken no previous anticoagulant or antiplatelet                            agents. ASA Grade Assessment: II - A patient with  mild systemic disease. After reviewing the risks                            and benefits, the patient was deemed in                            satisfactory condition to undergo the procedure.                           After obtaining informed consent, the endoscope was                            passed under direct vision. Throughout the                       procedure, the patient's blood pressure, pulse, and                            oxygen saturations were monitored continuously. The                            EG-[id number] ([id number]) scope was introduced through the                            mouth, and advanced to the second part of duodenum.                            The upper GI endoscopy was accomplished without                            difficulty. The patient tolerated the procedure                            well. Scope In: 1:34:18 PM Scope Out: 1:37:59 PM Total Procedure Duration: 0 hours 3 minutes 41 seconds  Findings:      The examined esophagus was normal.      A small hiatal hernia was present.      mottled, patchy erythema of the gastric mucosa diffusely. No ulcer or       infiltrating process. Patent pylorus.      Multiple diffuse erosions were found in the duodenal bulb. gastric       mucosa was biopsied with a cold forceps for histology. Estimated blood       loss was minimal. Impression:               - Normal esophagus.                           - Small hiatal hernia.                           - abnormal gastric mucosa?"status post biopsy..                           - Duodenal erosions. Moderate Sedation:      Moderate (conscious) sedation was administered by the endoscopy nurse  and supervised by the endoscopist. The following parameters were       monitored: oxygen saturation, heart rate, blood pressure, respiratory       rate, EKG, adequacy of pulmonary ventilation, and response to care.       Total physician intraservice time was 10 minutes. Recommendation:           - Patient has a contact number available for                            emergencies. The signs and symptoms of potential                            delayed complications were discussed with the                            patient. Return to normal activities tomorrow.                            Written discharge instructions were  provided to the                            patient.                           - Resume previous diet.                           - Continue present medications.                           - Await pathology results.                           - office visit in 2 months. Procedure Code(s):        --- Professional ---                           [phone], Esophagogastroduodenoscopy, flexible,                            transoral; with biopsy, single or multiple                           99152, Moderate sedation services provided by the                            same physician or other qualified health care                            professional performing the diagnostic or                            therapeutic service that the sedation supports,                            requiring the presence of an independent trained  observer to assist in the monitoring of the                            patient's level of consciousness and physiological                            status; initial 15 minutes of intraservice time,                            patient age 4 years or older Diagnosis Code(s):        --- Professional ---                           K44.9, Diaphragmatic hernia without obstruction or                            gangrene                           K31.89, Other diseases of stomach and duodenum                           K26.9, Duodenal ulcer, unspecified as acute or                            chronic, without hemorrhage or perforation CPT copyright 2016 American Medical Association. All rights reserved. The codes documented in this report are preliminary and upon coder review may  be revised to meet current compliance requirements. [name], MD Norvel Richards, MD 06/05/2016 1:47:06 PM This report has been signed electronically. Number of Addenda: 0

## 2016-06-05 NOTE — Interval H&P Note (Signed)
History and Physical Interval Note:  06/05/2016 1:25 PM  Else Festus Holts  has presented today for surgery, with the diagnosis of GERD, dysphagia, H. Pylori  The various methods of treatment have been discussed with the patient and family. After consideration of risks, benefits and other options for treatment, the patient has consented to  Procedure(s) with comments: ESOPHAGOGASTRODUODENOSCOPY (EGD) (N/A) - 2:15pm MALONEY DILATION (N/A) as a surgical intervention .  The patient's history has been reviewed, patient examined, no change in status, stable for surgery.  I have reviewed the patient's chart and labs.  Questions were answered to the patient's satisfaction.     Patient really denies dysphagia this time. States all of her GI symptoms have improved. EGD upper plan today.The risks, benefits, limitations, alternatives and imponderables have been reviewed with the patient. Potential for esophageal dilation, biopsy, etc. have also been reviewed.  Questions have been answered. All parties agreeable.   Manus Rudd

## 2016-06-11 ENCOUNTER — Encounter (HOSPITAL_COMMUNITY): Payer: Self-pay | Admitting: Internal Medicine

## 2016-06-11 ENCOUNTER — Encounter: Payer: Self-pay | Admitting: Internal Medicine

## 2016-08-12 ENCOUNTER — Encounter: Payer: Self-pay | Admitting: Internal Medicine

## 2016-08-18 ENCOUNTER — Ambulatory Visit: Payer: BLUE CROSS/BLUE SHIELD | Admitting: Nurse Practitioner

## 2016-09-03 ENCOUNTER — Ambulatory Visit (INDEPENDENT_AMBULATORY_CARE_PROVIDER_SITE_OTHER): Payer: BLUE CROSS/BLUE SHIELD | Admitting: Gastroenterology

## 2016-09-03 ENCOUNTER — Encounter: Payer: Self-pay | Admitting: Gastroenterology

## 2016-09-03 DIAGNOSIS — R110 Nausea: Secondary | ICD-10-CM

## 2016-09-03 MED ORDER — ONDANSETRON HCL 4 MG PO TABS: 4.0000 mg | ORAL_TABLET | Freq: Three times a day (TID) | ORAL | 1 refills | Status: AC | PRN

## 2016-09-03 NOTE — Progress Notes (Signed)
Referring Provider: Yevette Edwards, NP Primary Care Physician:  Curlene Labrum, MD Primary GI: Dr. Gala Romney   Chief Complaint  Patient presents with  . Gastroesophageal Reflux  . Dysphagia    HPI:   Amanda [name] is a 61 y.o. female presenting today with a history of GERD, dysphagia. Recent EGD with normal esophagus, small hiatal hernia, chronic gastritis, duodenal erosions.   States she feels like she has an infection in her left upper gum. States it taste like poison. Has back pain and arm pain when touching. No epigastric pain. Avoids spicy foods or crunchy foods. Has a lot of spit come up in her mouth. Mild nausea. No dysphagia. Feels sick on stomach all the time. Feels full off of small amounts of food. No vomiting. Appetite fair.   CT from [facility] with circumferential narrowing gastric antrum that may be related to peristalsis.   Past Medical History:  Diagnosis Date  . Adult BMI 30+   . Arthralgia   . COPD (chronic obstructive pulmonary disease) ([place])   . Depression   . Essential hypertension, benign   . Hot flashes   . Hypercholesteremia   . Iron deficiency anemia    Menometrorrhagia  . Obesity   . Type 2 diabetes mellitus ([place])     Past Surgical History:  Procedure Laterality Date  . ABDOMINAL HYSTERECTOMY    . BIOPSY  06/05/2016   Procedure: BIOPSY;  Surgeon: Daneil Dolin, MD;  [facility];  Service: Endoscopy;;  gastric  . COLONOSCOPY N/A 12/06/2012   Dr. Gala Romney: tubular adenoma. Surveillance in 2019  . ESOPHAGOGASTRODUODENOSCOPY N/A 06/05/2016   Dr. Gala Romney: normal esophagus, small hiatal hernia, chronic gastritis, duodenal erosions   . Excision pilonidal abscess    . MALONEY DILATION N/A 06/05/2016   Procedure: Venia Minks DILATION;  Surgeon: Daneil Dolin, MD;  [facility];  Service: Endoscopy;  Laterality: N/A;  . TUBAL LIGATION      Current Outpatient Prescriptions  Medication Sig Dispense Refill  . acetaminophen  (TYLENOL) 500 MG tablet Take 500 mg by mouth as needed.    Marland Kitchen albuterol (PROVENTIL HFA;VENTOLIN HFA) 108 (90 BASE) MCG/ACT inhaler Inhale 1 puff into the lungs every 6 (six) hours as needed for wheezing or shortness of breath.    Marland Kitchen amitriptyline (ELAVIL) 25 MG tablet Take 25 mg by mouth at bedtime.    Marland Kitchen aspirin 81 MG tablet Take 81 mg by mouth.     . cholecalciferol (VITAMIN D) 1000 units tablet Take 1,000 Units by mouth daily.    . fish oil-omega-3 fatty acids 1000 MG capsule Take 2 g by mouth daily.    . hydroxypropyl methylcellulose (ISOPTO TEARS) 2.5 % ophthalmic solution Place 1 drop into both eyes daily as needed for dry eyes.    Marland Kitchen loratadine (CLARITIN) 10 MG tablet Take 10 mg by mouth daily.    Marland Kitchen omeprazole (PRILOSEC) 40 MG capsule Take 1 capsule (40 mg total) by mouth daily. 30 capsule 3  . sertraline (ZOLOFT) 25 MG tablet Take 25 mg by mouth daily.    . traZODone (DESYREL) 50 MG tablet Take 1 tablet by mouth at bedtime.  2  . vitamin B-12 (CYANOCOBALAMIN) 1000 MCG tablet Take 1,000 mcg by mouth daily.    Marland Kitchen estrogens, conjugated, (PREMARIN) 0.3 MG tablet Take 0.3 mg by mouth daily. Take daily for 21 days then do not take for 7 days.    . ramipril (ALTACE) 2.5 MG  capsule Take 2.5 mg by mouth daily.    . simvastatin (ZOCOR) 40 MG tablet Take 40 mg by mouth at bedtime.     No current facility-administered medications for this visit.     Allergies as of 09/03/2016  . (No Known Allergies)    Family History  Problem Relation Age of Onset  . Breast cancer Unknown   . Colon cancer Brother 21    Social History   Social History  . Marital status: Legally Separated    Spouse name: N/A  . Number of children: N/A  . Years of education: N/A   Social History Main Topics  . Smoking status: Never Smoker  . Smokeless tobacco: Never Used  . Alcohol use No  . Drug use: No  . Sexual activity: Yes    Birth control/ protection: Surgical   Other Topics Concern  . None   Social History  Narrative  . None    Review of Systems: As mentioned in HPI   Physical Exam: BP (!) 149/96   Pulse 79   Temp 97.7 F (36.5 C) (Oral)   Ht 5\' 8"  (1.727 m)   Wt 185 lb 6.4 oz (84.1 kg)   BMI 28.19 kg/m  General:   Alert and oriented. No distress noted. Pleasant and cooperative.  Head:  Normocephalic and atraumatic. Eyes:  Conjuctiva clear without scleral icterus. Mouth:  Oral mucosa pink and moist.  Abdomen:  +BS, soft, non-tender and non-distended. No rebound or guarding. No HSM or masses noted. Msk:  Symmetrical without gross deformities. Normal posture. Extremities:  Without edema. Neurologic:  Alert and  oriented x4 Psych:  Alert and cooperative. Normal mood and affect.

## 2016-09-03 NOTE — Patient Instructions (Signed)
I have sent in Zofran to take as needed for nausea.   I will review the CT with the radiologist.   We will see you in 3 months.

## 2016-09-08 ENCOUNTER — Telehealth: Payer: Self-pay | Admitting: Internal Medicine

## 2016-09-08 NOTE — Telephone Encounter (Signed)
noted 

## 2016-09-08 NOTE — Telephone Encounter (Signed)
We received a CD from [facility] on this patient and I placed it in the wooden tray in AB office.

## 2016-09-09 NOTE — Progress Notes (Signed)
CC'ED TO PCP 

## 2016-09-09 NOTE — Assessment & Plan Note (Signed)
61 year old female presenting with history of GERD, dysphagia, recent EGD with normal esophagus, small hiatal hernia, chronic gastritis, duodenal erosions. CT thereafter at [facility] with circumferential narrowing gastric antrum that may be related to peristalsis. Patient has vague nausea, full off of small amounts of food. As mentioned, EGD recently completed. Her weight is stable. Discussed continuing PPI, add Zofran as needed, and I will review CT with radiology here. Requesting actual CT reports. Likely findings are related to peristalsis as she has a recent EGD completed. Return in 3 months.

## 2016-10-01 NOTE — Telephone Encounter (Signed)
Amanda [name] or Amanda Sciara, do you know what we need to do for this?

## 2016-10-01 NOTE — Telephone Encounter (Signed)
How do we have this read by our radiologist? I have the CD on my desk. Just let me know what I need to do. I want to see if they see any abnormalities with stomach. It is likely peristalsis that was noted, but I want to make sure. May need follow-up with repeat CT. Start with having it read by our radiologist.

## 2016-10-01 NOTE — Telephone Encounter (Signed)
Dr.Boles said to just bring it over and he will look at it in the morning.

## 2016-10-02 NOTE — Telephone Encounter (Signed)
Reviewed CT with Dr. Thornton Papas. Peristalsis noted, no concerning findings. She has an updated EGD on file. No further imaging needed at this time. Return in Nov as planned.

## 2016-10-06 NOTE — Telephone Encounter (Signed)
Letter mailed to the pt. 

## 2016-12-10 ENCOUNTER — Ambulatory Visit: Payer: BLUE CROSS/BLUE SHIELD | Admitting: Gastroenterology

## 2017-02-04 ENCOUNTER — Ambulatory Visit: Payer: BLUE CROSS/BLUE SHIELD | Admitting: Gastroenterology

## 2017-10-22 ENCOUNTER — Ambulatory Visit (INDEPENDENT_AMBULATORY_CARE_PROVIDER_SITE_OTHER): Payer: BLUE CROSS/BLUE SHIELD | Admitting: Otolaryngology

## 2017-10-22 DIAGNOSIS — H903 Sensorineural hearing loss, bilateral: Secondary | ICD-10-CM

## 2017-10-22 DIAGNOSIS — R42 Dizziness and giddiness: Secondary | ICD-10-CM | POA: Diagnosis not present

## 2017-10-22 DIAGNOSIS — H6123 Impacted cerumen, bilateral: Secondary | ICD-10-CM

## 2017-10-30 ENCOUNTER — Encounter: Payer: Self-pay | Admitting: Internal Medicine

## 2018-04-16 ENCOUNTER — Other Ambulatory Visit (HOSPITAL_COMMUNITY): Payer: Self-pay | Admitting: General Practice

## 2018-04-16 DIAGNOSIS — Z1231 Encounter for screening mammogram for malignant neoplasm of breast: Secondary | ICD-10-CM

## 2018-07-12 ENCOUNTER — Other Ambulatory Visit: Payer: Self-pay

## 2018-07-12 DIAGNOSIS — Z20822 Contact with and (suspected) exposure to covid-19: Secondary | ICD-10-CM

## 2018-07-15 LAB — NOVEL CORONAVIRUS, NAA: SARS-CoV-2, NAA: NOT DETECTED

## 2018-12-21 DIAGNOSIS — R350 Frequency of micturition: Secondary | ICD-10-CM | POA: Diagnosis not present

## 2018-12-21 DIAGNOSIS — M5431 Sciatica, right side: Secondary | ICD-10-CM | POA: Diagnosis not present

## 2019-02-07 DIAGNOSIS — R110 Nausea: Secondary | ICD-10-CM | POA: Diagnosis not present

## 2019-02-07 DIAGNOSIS — B349 Viral infection, unspecified: Secondary | ICD-10-CM | POA: Diagnosis not present

## 2019-04-17 DIAGNOSIS — I10 Essential (primary) hypertension: Secondary | ICD-10-CM | POA: Diagnosis not present

## 2019-04-17 DIAGNOSIS — Z8673 Personal history of transient ischemic attack (TIA), and cerebral infarction without residual deficits: Secondary | ICD-10-CM | POA: Diagnosis not present

## 2019-04-17 DIAGNOSIS — R110 Nausea: Secondary | ICD-10-CM | POA: Diagnosis not present

## 2019-04-17 DIAGNOSIS — H9202 Otalgia, left ear: Secondary | ICD-10-CM | POA: Diagnosis not present

## 2019-04-17 DIAGNOSIS — R42 Dizziness and giddiness: Secondary | ICD-10-CM | POA: Diagnosis not present

## 2019-04-17 DIAGNOSIS — Z8249 Family history of ischemic heart disease and other diseases of the circulatory system: Secondary | ICD-10-CM | POA: Diagnosis not present

## 2019-04-17 DIAGNOSIS — H9212 Otorrhea, left ear: Secondary | ICD-10-CM | POA: Diagnosis not present

## 2019-05-05 DIAGNOSIS — H9312 Tinnitus, left ear: Secondary | ICD-10-CM | POA: Diagnosis not present

## 2019-05-05 DIAGNOSIS — I10 Essential (primary) hypertension: Secondary | ICD-10-CM | POA: Diagnosis not present

## 2019-05-05 DIAGNOSIS — J309 Allergic rhinitis, unspecified: Secondary | ICD-10-CM | POA: Diagnosis not present

## 2019-10-16 ENCOUNTER — Encounter: Payer: Self-pay | Admitting: Emergency Medicine

## 2019-10-16 ENCOUNTER — Ambulatory Visit
Admission: EM | Admit: 2019-10-16 | Discharge: 2019-10-16 | Disposition: A | Discharge status: Home / Self Care | Payer: Medicare Other | Attending: Emergency Medicine | Admitting: Emergency Medicine

## 2019-10-16 DIAGNOSIS — Z1152 Encounter for screening for COVID-19: Secondary | ICD-10-CM

## 2019-10-16 DIAGNOSIS — R059 Cough, unspecified: Secondary | ICD-10-CM

## 2019-10-16 DIAGNOSIS — R05 Cough: Secondary | ICD-10-CM

## 2019-10-16 DIAGNOSIS — B029 Zoster without complications: Secondary | ICD-10-CM

## 2019-10-16 MED ORDER — BENZONATATE 100 MG PO CAPS: 100.0000 mg | ORAL_CAPSULE | Freq: Three times a day (TID) | ORAL | 0 refills | Status: DC

## 2019-10-16 MED ORDER — PREDNISONE 10 MG PO TABS: 20.0000 mg | ORAL_TABLET | Freq: Every day | ORAL | 0 refills | Status: DC

## 2019-10-16 MED ORDER — VALACYCLOVIR HCL 1 G PO TABS: 1000.0000 mg | ORAL_TABLET | Freq: Three times a day (TID) | ORAL | 0 refills | Status: AC

## 2019-10-16 NOTE — ED Provider Notes (Signed)
[place]   [id number] 10/16/19 Arrival Time: 5809  Chief Complaint  Patient presents with  . Rash     SUBJECTIVE: History from: patient.  Amanda [name] is a 64 y.o. female presented to the urgent care with a complaint of rash to left side of her chest for the past 2 weeks.  Denies any precipitating event.  Localized rash to left chest..Described as itchy and burning.  S has OTC medication without relief.  Denies aggravating factors.  Denies similar symptoms in the past.    She is also complaining of cough for the past few days.  Denies any precipitating or exposure to COVID, flu or strep.  Also OTC medication without relief.  Denies aggravating or alleviating factor.  Denies any symptom events.  Denies cough, fever, nausea, vomiting, diarrhea.  ROS: As per HPI.  All other pertinent ROS negative.     Past Medical History:  Diagnosis Date  . Adult BMI 30+   . Arthralgia   . COPD (chronic obstructive pulmonary disease) ([place])   . Depression   . Essential hypertension, benign   . Hot flashes   . Hypercholesteremia   . Iron deficiency anemia    Menometrorrhagia  . Obesity   . Type 2 diabetes mellitus ([place])    Past Surgical History:  Procedure Laterality Date  . ABDOMINAL HYSTERECTOMY    . BIOPSY  06/05/2016   Procedure: BIOPSY;  Surgeon: Daneil Dolin, MD;  [facility];  Service: Endoscopy;;  gastric  . COLONOSCOPY N/A 12/06/2012   Dr. Gala Romney: tubular adenoma. Surveillance in 2019  . ESOPHAGOGASTRODUODENOSCOPY N/A 06/05/2016   Dr. Gala Romney: normal esophagus, small hiatal hernia, chronic gastritis, duodenal erosions   . Excision pilonidal abscess    . MALONEY DILATION N/A 06/05/2016   Procedure: Venia Minks DILATION;  Surgeon: Daneil Dolin, MD;  [facility];  Service: Endoscopy;  Laterality: N/A;  . TUBAL LIGATION     No Known Allergies No current facility-administered medications on file prior to encounter.   Current Outpatient  Medications on File Prior to Encounter  Medication Sig Dispense Refill  . acetaminophen (TYLENOL) 500 MG tablet Take 500 mg by mouth as needed.    Marland Kitchen albuterol (PROVENTIL HFA;VENTOLIN HFA) 108 (90 BASE) MCG/ACT inhaler Inhale 1 puff into the lungs every 6 (six) hours as needed for wheezing or shortness of breath.    Marland Kitchen amitriptyline (ELAVIL) 25 MG tablet Take 25 mg by mouth at bedtime.    Marland Kitchen aspirin 81 MG tablet Take 81 mg by mouth.     . cholecalciferol (VITAMIN D) 1000 units tablet Take 1,000 Units by mouth daily.    . fish oil-omega-3 fatty acids 1000 MG capsule Take 2 g by mouth daily.    . hydroxypropyl methylcellulose (ISOPTO TEARS) 2.5 % ophthalmic solution Place 1 drop into both eyes daily as needed for dry eyes.    Marland Kitchen loratadine (CLARITIN) 10 MG tablet Take 10 mg by mouth daily.    Marland Kitchen omeprazole (PRILOSEC) 40 MG capsule Take 1 capsule (40 mg total) by mouth daily. 30 capsule 3  . ondansetron (ZOFRAN) 4 MG tablet Take 1 tablet (4 mg total) by mouth every 8 (eight) hours as needed for nausea or vomiting. 30 tablet 1  . sertraline (ZOLOFT) 25 MG tablet Take 25 mg by mouth daily.    . traZODone (DESYREL) 50 MG tablet Take 1 tablet by mouth at bedtime.  2  . vitamin B-12 (CYANOCOBALAMIN) 1000 MCG tablet Take  1,000 mcg by mouth daily.     Social History   Socioeconomic History  . Marital status: Legally Separated    Spouse name: Not on file  . Number of children: Not on file  . Years of education: Not on file  . Highest education level: Not on file  Occupational History  . Not on file  Tobacco Use  . Smoking status: Never Smoker  . Smokeless tobacco: Never Used  Vaping Use  . Vaping Use: Never used  Substance and Sexual Activity  . Alcohol use: No  . Drug use: No  . Sexual activity: Yes    Birth control/protection: Surgical  Other Topics Concern  . Not on file  Social History Narrative  . Not on file   Social Determinants of Health   Financial Resource Strain:   . Difficulty  of Paying Living Expenses: Not on file  Food Insecurity:   . Worried About Charity fundraiser in the Last Year: Not on file  . Ran Out of Food in the Last Year: Not on file  Transportation Needs:   . Lack of Transportation (Medical): Not on file  . Lack of Transportation (Non-Medical): Not on file  Physical Activity:   . Days of Exercise per Week: Not on file  . Minutes of Exercise per Session: Not on file  Stress:   . Feeling of Stress : Not on file  Social Connections:   . Frequency of Communication with Friends and Family: Not on file  . Frequency of Social Gatherings with Friends and Family: Not on file  . Attends Religious Services: Not on file  . Active Member of Clubs or Organizations: Not on file  . Attends Archivist Meetings: Not on file  . Marital Status: Not on file  Intimate Partner Violence:   . Fear of Current or Ex-Partner: Not on file  . Emotionally Abused: Not on file  . Physically Abused: Not on file  . Sexually Abused: Not on file   Family History  Problem Relation Age of Onset  . Breast cancer Other   . Colon cancer Brother 36    OBJECTIVE:  Vitals:   10/16/19 1555  BP: (!) 165/95  Pulse: (!) 107  Resp: 19  Temp: 98.8 F (37.1 C)  TempSrc: Oral  SpO2: 97%  Weight: 180 lb (81.6 kg)  Height: 5\' 8"  (1.727 m)     General appearance: alert; appears fatigued, but nontoxic; speaking in full sentences and tolerating own secretions HEENT: NCAT; Ears: EACs clear, TMs pearly gray; Eyes: PERRL.  EOM grossly intact. Sinuses: nontender; Nose: nares patent without rhinorrhea, Throat: oropharynx clear, tonsils non erythematous or enlarged, uvula midline  Neck: supple without LAD Lungs: unlabored respirations, symmetrical air entry; cough: moderate; no respiratory distress; CTAB Heart: regular rate and rhythm.  Radial pulses 2+ symmetrical bilaterally Skin: warm and dry Psychological: alert and cooperative; normal mood and affect  LABS:  No  results found for this or any previous visit (from the past 24 hour(s)).   ASSESSMENT & PLAN:  1. Cough   2. Encounter for screening for COVID-19   3. Herpes zoster without complication     Meds ordered this encounter  Medications  . valACYclovir (VALTREX) 1000 MG tablet    Sig: Take 1 tablet (1,000 mg total) by mouth 3 (three) times daily.    Dispense:  21 tablet    Refill:  0  . benzonatate (TESSALON) 100 MG capsule    Sig: Take 1  capsule (100 mg total) by mouth every 8 (eight) hours.    Dispense:  30 capsule    Refill:  0  . predniSONE (DELTASONE) 10 MG tablet    Sig: Take 2 tablets (20 mg total) by mouth daily.    Dispense:  15 tablet    Refill:  0   Discharge instructions  COVID-19 test will take 2 to 7 days for results to return.  Someone will call if your result is positive. Tessalon Perles prescribed for cough Prednisone was prescribed Valacyclovir was prescribed for shingles Take medication as prescribed and to completion  Call or go to the ED if you have any new or worsening symptoms such as fever, worsening cough, shortness of breath, chest tightness, chest pain, turning blue, changes in mental status, etc...    Reviewed expectations re: course of current medical issues. Questions answered. Outlined signs and symptoms indicating need for more acute intervention. Patient verbalized understanding. After Visit Summary given.         Emerson Monte, [place] 10/16/19 1621

## 2019-10-16 NOTE — ED Triage Notes (Signed)
Pt has rash to LT side of chest that is painful and itching x 2 weeks. Also has a cough that started a week ago.

## 2019-10-16 NOTE — Discharge Instructions (Signed)
COVID-19 test will take 2 to 7 days for results to return.  Someone will call if your result is positive. Tessalon Perles prescribed for cough Prednisone was prescribed Valacyclovir was prescribed for shingles Take medication as prescribed and to completion

## 2019-10-18 LAB — NOVEL CORONAVIRUS, NAA: SARS-CoV-2, NAA: NOT DETECTED

## 2019-10-18 LAB — SARS-COV-2, NAA 2 DAY TAT

## 2021-06-22 ENCOUNTER — Ambulatory Visit
Admission: EM | Admit: 2021-06-22 | Discharge: 2021-06-22 | Disposition: A | Discharge status: Home / Self Care | Payer: Medicare HMO | Attending: Family Medicine | Admitting: Family Medicine

## 2021-06-22 DIAGNOSIS — B029 Zoster without complications: Secondary | ICD-10-CM

## 2021-06-22 MED ORDER — DEXAMETHASONE SODIUM PHOSPHATE 10 MG/ML IJ SOLN
10.0000 mg | Freq: Once | INTRAMUSCULAR | Status: AC
  Administered 2021-06-22: 10 mg via INTRAMUSCULAR

## 2021-06-22 MED ORDER — GABAPENTIN 100 MG PO CAPS: 100.0000 mg | ORAL_CAPSULE | Freq: Three times a day (TID) | ORAL | 0 refills | Status: AC | PRN

## 2021-06-22 NOTE — ED Provider Notes (Signed)
[facility]    CSN: [id number] Arrival date & time: 06/22/21  1508      History   Chief Complaint Chief Complaint  Patient presents with   Rash    Rash on neck    HPI Amanda [name] is a 66 y.o. female.   Presenting today following up on shingles rash to the neck that has been followed by PCP for about 2 weeks now.  She has been taking Valtrex, prednisone, gabapentin and using lidocaine cream and states this is not taking the pain or itching away.  Denies new exposures, new medications, sick contacts.  Has had shingles in the past and it felt similar.   Past Medical History:  Diagnosis Date   Adult BMI 30+    Arthralgia    COPD (chronic obstructive pulmonary disease) (HCC)    Depression    Essential hypertension, benign    Hot flashes    Hypercholesteremia    Iron deficiency anemia    Menometrorrhagia   Obesity    Type 2 diabetes mellitus ([place])     Patient Active Problem List   Diagnosis Date Noted   Nausea without vomiting 09/03/2016   GERD (gastroesophageal reflux disease) 05/19/2016   Dysphagia 05/19/2016   H. pylori infection 05/19/2016   Shortness of breath 12/20/2012   Essential hypertension, benign 12/20/2012   Mixed hyperlipidemia 12/20/2012   Type 2 diabetes mellitus ([place]) 12/20/2012    Past Surgical History:  Procedure Laterality Date   ABDOMINAL HYSTERECTOMY     BIOPSY  06/05/2016   Procedure: BIOPSY;  Surgeon: Daneil Dolin, MD;  [facility];  Service: Endoscopy;;  gastric   COLONOSCOPY N/A 12/06/2012   Dr. Gala Romney: tubular adenoma. Surveillance in 2019   ESOPHAGOGASTRODUODENOSCOPY N/A 06/05/2016   Dr. Gala Romney: normal esophagus, small hiatal hernia, chronic gastritis, duodenal erosions    Excision pilonidal abscess     MALONEY DILATION N/A 06/05/2016   Procedure: Venia Minks DILATION;  Surgeon: Daneil Dolin, MD;  [facility];  Service: Endoscopy;  Laterality: N/A;   TUBAL LIGATION      OB History   No  obstetric history on file.      Home Medications    Prior to Admission medications   Medication Sig Start Date End Date Taking? Authorizing Provider  gabapentin (NEURONTIN) 100 MG capsule Take 1 capsule (100 mg total) by mouth 3 (three) times daily as needed. 06/22/21  Yes Volney American, PA-C  acetaminophen (TYLENOL) 500 MG tablet Take 500 mg by mouth as needed.    [provider]  albuterol (PROVENTIL HFA;VENTOLIN HFA) 108 (90 BASE) MCG/ACT inhaler Inhale 1 puff into the lungs every 6 (six) hours as needed for wheezing or shortness of breath.    [provider]  amitriptyline (ELAVIL) 25 MG tablet Take 25 mg by mouth at bedtime.    [provider]  aspirin 81 MG tablet Take 81 mg by mouth.     [provider]  benzonatate (TESSALON) 100 MG capsule Take 1 capsule (100 mg total) by mouth every 8 (eight) hours. 10/16/19   Avegno, Darrelyn Hillock, FNP  cholecalciferol (VITAMIN D) 1000 units tablet Take 1,000 Units by mouth daily.    [provider]  fish oil-omega-3 fatty acids 1000 MG capsule Take 2 g by mouth daily.    [provider]  hydroxypropyl methylcellulose (ISOPTO TEARS) 2.5 % ophthalmic solution Place 1 drop into both eyes daily as needed for dry eyes.  [provider]  loratadine (CLARITIN) 10 MG tablet Take 10 mg by mouth daily.    [provider]  omeprazole (PRILOSEC) 40 MG capsule Take 1 capsule (40 mg total) by mouth daily. 05/19/16   Carlis Stable, NP  ondansetron (ZOFRAN) 4 MG tablet Take 1 tablet (4 mg total) by mouth every 8 (eight) hours as needed for nausea or vomiting. 09/03/16   Annitta Needs, NP  predniSONE (DELTASONE) 10 MG tablet Take 2 tablets (20 mg total) by mouth daily. 10/16/19   Avegno, Darrelyn Hillock, FNP  sertraline (ZOLOFT) 25 MG tablet Take 25 mg by mouth daily.    [provider]  traZODone (DESYREL) 50 MG tablet Take 1 tablet by mouth at bedtime. 08/27/16   [provider]   valACYclovir (VALTREX) 1000 MG tablet Take 1 tablet (1,000 mg total) by mouth 3 (three) times daily. 10/16/19   Avegno, Darrelyn Hillock, FNP  vitamin B-12 (CYANOCOBALAMIN) 1000 MCG tablet Take 1,000 mcg by mouth daily.    [provider]    Family History Family History  Problem Relation Age of Onset   Breast cancer Other    Colon cancer Brother 73    Social History Social History   Tobacco Use   Smoking status: Never   Smokeless tobacco: Never  Vaping Use   Vaping Use: Never used  Substance Use Topics   Alcohol use: No   Drug use: No     Allergies   Patient has no known allergies.   Review of Systems Review of Systems Per HPI  Physical Exam Triage Vital Signs ED Triage Vitals  Enc Vitals Group     BP 06/22/21 1520 115/73     Pulse Rate 06/22/21 1520 (!) 103     Resp 06/22/21 1520 20     Temp 06/22/21 1520 98.3 F (36.8 C)     Temp Source 06/22/21 1520 Oral     SpO2 06/22/21 1520 96 %     Weight --      Height --      Head Circumference --      Peak Flow --      Pain Score 06/22/21 1517 8     Pain Loc --      Pain Edu? --      Excl. in [place]? --    No data found.  Updated Vital Signs BP 115/73 (BP Location: Right Arm)   Pulse (!) 103   Temp 98.3 F (36.8 C) (Oral)   Resp 20   SpO2 96%   Visual Acuity Right Eye Distance:   Left Eye Distance:   Bilateral Distance:    Right Eye Near:   Left Eye Near:    Bilateral Near:     Physical Exam Vitals and nursing note reviewed.  Constitutional:      Appearance: Normal appearance. She is not ill-appearing.  HENT:     Head: Atraumatic.  Eyes:     Extraocular Movements: Extraocular movements intact.     Conjunctiva/sclera: Conjunctivae normal.  Cardiovascular:     Rate and Rhythm: Normal rate and regular rhythm.     Heart sounds: Normal heart sounds.  Pulmonary:     Effort: Pulmonary effort is normal.     Breath sounds: Normal breath sounds.  Musculoskeletal:        General: Normal range of  motion.     Cervical back: Normal range of motion and neck supple.  Skin:    General: Skin is warm.  Findings: Rash present.     Comments: Left side of base of neck with erythematous vesicular rash  Neurological:     Mental Status: She is alert and oriented to person, place, and time.  Psychiatric:        Mood and Affect: Mood normal.        Thought Content: Thought content normal.        Judgment: Judgment normal.     UC Treatments / Results  Labs (all labs ordered are listed, but only abnormal results are displayed) Labs Reviewed - No data to display  EKG   Radiology No results found.  Procedures Procedures (including critical care time)  Medications Ordered in UC Medications  dexamethasone (DECADRON) injection 10 mg (10 mg Intramuscular Given 06/22/21 1545)    Initial Impression / Assessment and Plan / UC Course  I have reviewed the triage vital signs and the nursing notes.  Pertinent labs & imaging results that were available during my care of the patient were reviewed by me and considered in my medical decision making (see chart for details).     Already completed Valtrex course, oral steroids with only mild improvement.  Treat with IM Decadron additionally, will refill gabapentin as she states that she is out.  Follow-up with PCP for recheck.  Final Clinical Impressions(s) / UC Diagnoses   Final diagnoses:  Herpes zoster without complication   Discharge Instructions   None    ED Prescriptions     Medication Sig Dispense Auth. Provider   gabapentin (NEURONTIN) 100 MG capsule Take 1 capsule (100 mg total) by mouth 3 (three) times daily as needed. 30 capsule Volney American, [place]      PDMP not reviewed this encounter.   Volney American, [place] 06/22/21 1552

## 2021-06-22 NOTE — ED Triage Notes (Signed)
Pt states she has had a rash on her neck for 2 weeks  Pt states she has pain on the inside and the outside of her neck   Pt states she was given meds for the rash at her PCP and she has finished the meds and she is still having issues

## 2021-08-22 DIAGNOSIS — Z1231 Encounter for screening mammogram for malignant neoplasm of breast: Secondary | ICD-10-CM | POA: Diagnosis not present

## 2021-08-28 DIAGNOSIS — T466X5A Adverse effect of antihyperlipidemic and antiarteriosclerotic drugs, initial encounter: Secondary | ICD-10-CM | POA: Diagnosis not present

## 2021-08-28 DIAGNOSIS — M791 Myalgia, unspecified site: Secondary | ICD-10-CM | POA: Diagnosis not present

## 2021-08-28 DIAGNOSIS — Z113 Encounter for screening for infections with a predominantly sexual mode of transmission: Secondary | ICD-10-CM | POA: Diagnosis not present

## 2021-08-28 DIAGNOSIS — R5383 Other fatigue: Secondary | ICD-10-CM | POA: Diagnosis not present

## 2021-08-28 DIAGNOSIS — Z6829 Body mass index (BMI) 29.0-29.9, adult: Secondary | ICD-10-CM | POA: Diagnosis not present

## 2021-08-28 DIAGNOSIS — E7849 Other hyperlipidemia: Secondary | ICD-10-CM | POA: Diagnosis not present

## 2021-08-28 DIAGNOSIS — I10 Essential (primary) hypertension: Secondary | ICD-10-CM | POA: Diagnosis not present

## 2021-08-28 DIAGNOSIS — R0602 Shortness of breath: Secondary | ICD-10-CM | POA: Diagnosis not present

## 2021-09-09 DIAGNOSIS — R0602 Shortness of breath: Secondary | ICD-10-CM | POA: Diagnosis not present

## 2021-09-09 DIAGNOSIS — R5383 Other fatigue: Secondary | ICD-10-CM | POA: Diagnosis not present

## 2021-12-25 DIAGNOSIS — Z20828 Contact with and (suspected) exposure to other viral communicable diseases: Secondary | ICD-10-CM | POA: Diagnosis not present

## 2021-12-25 DIAGNOSIS — J019 Acute sinusitis, unspecified: Secondary | ICD-10-CM | POA: Diagnosis not present

## 2021-12-25 DIAGNOSIS — R059 Cough, unspecified: Secondary | ICD-10-CM | POA: Diagnosis not present

## 2022-01-14 DIAGNOSIS — J45909 Unspecified asthma, uncomplicated: Secondary | ICD-10-CM | POA: Diagnosis not present

## 2022-01-14 DIAGNOSIS — M1711 Unilateral primary osteoarthritis, right knee: Secondary | ICD-10-CM | POA: Diagnosis not present

## 2022-01-14 DIAGNOSIS — G2581 Restless legs syndrome: Secondary | ICD-10-CM | POA: Diagnosis not present

## 2022-01-14 DIAGNOSIS — G4700 Insomnia, unspecified: Secondary | ICD-10-CM | POA: Diagnosis not present

## 2022-01-14 DIAGNOSIS — I10 Essential (primary) hypertension: Secondary | ICD-10-CM | POA: Diagnosis not present

## 2022-01-14 DIAGNOSIS — M79651 Pain in right thigh: Secondary | ICD-10-CM | POA: Diagnosis not present

## 2022-01-14 DIAGNOSIS — E785 Hyperlipidemia, unspecified: Secondary | ICD-10-CM | POA: Diagnosis not present

## 2022-01-14 DIAGNOSIS — M79661 Pain in right lower leg: Secondary | ICD-10-CM | POA: Diagnosis not present

## 2022-01-14 DIAGNOSIS — Z79899 Other long term (current) drug therapy: Secondary | ICD-10-CM | POA: Diagnosis not present

## 2022-01-14 DIAGNOSIS — M79604 Pain in right leg: Secondary | ICD-10-CM | POA: Diagnosis not present

## 2022-01-16 DIAGNOSIS — Z6829 Body mass index (BMI) 29.0-29.9, adult: Secondary | ICD-10-CM | POA: Diagnosis not present

## 2022-01-16 DIAGNOSIS — R030 Elevated blood-pressure reading, without diagnosis of hypertension: Secondary | ICD-10-CM | POA: Diagnosis not present

## 2022-01-16 DIAGNOSIS — M5410 Radiculopathy, site unspecified: Secondary | ICD-10-CM | POA: Diagnosis not present

## 2022-02-25 DIAGNOSIS — H10812 Pingueculitis, left eye: Secondary | ICD-10-CM | POA: Diagnosis not present

## 2022-03-19 DIAGNOSIS — R030 Elevated blood-pressure reading, without diagnosis of hypertension: Secondary | ICD-10-CM | POA: Diagnosis not present

## 2022-03-19 DIAGNOSIS — R519 Headache, unspecified: Secondary | ICD-10-CM | POA: Diagnosis not present

## 2022-03-19 DIAGNOSIS — R590 Localized enlarged lymph nodes: Secondary | ICD-10-CM | POA: Diagnosis not present

## 2022-03-19 DIAGNOSIS — R221 Localized swelling, mass and lump, neck: Secondary | ICD-10-CM | POA: Diagnosis not present

## 2022-03-25 NOTE — Progress Notes (Incomplete)
[facility] [address], [place] [address]   Clinic Day:  03/25/2022  Referring physician: Curlene Labrum, MD  Patient Care Team: Curlene Labrum, MD as PCP - General (Family Medicine) Satira Sark, MD as Consulting Physician (Cardiology) Gala Romney Cristopher Estimable, MD as Consulting Physician (Gastroenterology)   ASSESSMENT & PLAN:   Assessment: ***  Plan: ***  No orders of the defined types were placed in this encounter.     I,Alexis Herring,acting as a Education administrator for Derek Jack, MD.,have documented all relevant documentation on the behalf of Derek Jack, MD,as directed by  Derek Jack, MD while in the presence of Derek Jack, MD.   ***  Ubaldo Glassing Herring   3/5/20249:20 PM  CHIEF COMPLAINT/PURPOSE OF CONSULT:   Diagnosis: ***  Cancer Staging  No matching staging information was found for the patient.   Prior Therapy: ***  Current Therapy:  ***   HISTORY OF PRESENT ILLNESS:   Oncology History   No history exists.      Dejanae is a 67 y.o. female presenting to clinic today for evaluation of supraclavicular lymphadenopathy at the request of [facility].  Per referral records, patient had a similar episode of lymph node swelling x1 year ago and had a biopsy which was normal at that time.  She had a US soft tissue head and neck on 02/05/21 which showed: 3 mm hypoechoic structure within the right supraclavicular fossa, likely a subcentimeter physiologic lymph node. She had a pathology smear test on 02/07/21 which showed: Normal smear.  No evidence of lymphoma cells.   Today, she states that she is doing well overall. Her appetite level is at ***%. Her energy level is at ***%. She denies any {oncneg:29081}.    PAST MEDICAL HISTORY:   Past Medical History: Past Medical History:  Diagnosis Date   Adult BMI 30+    Arthralgia    COPD (chronic obstructive pulmonary disease) ([place])    Depression     Essential hypertension, benign    Hot flashes    Hypercholesteremia    Iron deficiency anemia    Menometrorrhagia   Obesity    Type 2 diabetes mellitus ([place])     Surgical History: Past Surgical History:  Procedure Laterality Date   ABDOMINAL HYSTERECTOMY     BIOPSY  06/05/2016   Procedure: BIOPSY;  Surgeon: Daneil Dolin, MD;  [facility];  Service: Endoscopy;;  gastric   COLONOSCOPY N/A 12/06/2012   Dr. Gala Romney: tubular adenoma. Surveillance in 2019   ESOPHAGOGASTRODUODENOSCOPY N/A 06/05/2016   Dr. Gala Romney: normal esophagus, small hiatal hernia, chronic gastritis, duodenal erosions    Excision pilonidal abscess     MALONEY DILATION N/A 06/05/2016   Procedure: Venia Minks DILATION;  Surgeon: Daneil Dolin, MD;  [facility];  Service: Endoscopy;  Laterality: N/A;   TUBAL LIGATION      Social History: Social History   Socioeconomic History   Marital status: Legally Separated    Spouse name: Not on file   Number of children: Not on file   Years of education: Not on file   Highest education level: Not on file  Occupational History   Not on file  Tobacco Use   Smoking status: Never   Smokeless tobacco: Never  Vaping Use   Vaping Use: Never used  Substance and Sexual Activity   Alcohol use: No   Drug use: No   Sexual activity: Yes    Birth control/protection: Surgical  Other Topics Concern   Not on file  Social History Narrative   Not on file   Social Determinants of Health   Financial Resource Strain: Not on file  Food Insecurity: Not on file  Transportation Needs: Not on file  Physical Activity: Not on file  Stress: Not on file  Social Connections: Not on file  Intimate Partner Violence: Not on file    Family History: Family History  Problem Relation Age of Onset   Breast cancer Other    Colon cancer Brother 15    Current Medications:  Current Outpatient Medications:    acetaminophen (TYLENOL) 500 MG tablet, Take 500 mg by mouth as  needed., Disp: , Rfl:    albuterol (PROVENTIL HFA;VENTOLIN HFA) 108 (90 BASE) MCG/ACT inhaler, Inhale 1 puff into the lungs every 6 (six) hours as needed for wheezing or shortness of breath., Disp: , Rfl:    amitriptyline (ELAVIL) 25 MG tablet, Take 25 mg by mouth at bedtime., Disp: , Rfl:    aspirin 81 MG tablet, Take 81 mg by mouth. , Disp: , Rfl:    benzonatate (TESSALON) 100 MG capsule, Take 1 capsule (100 mg total) by mouth every 8 (eight) hours., Disp: 30 capsule, Rfl: 0   cholecalciferol (VITAMIN D) 1000 units tablet, Take 1,000 Units by mouth daily., Disp: , Rfl:    fish oil-omega-3 fatty acids 1000 MG capsule, Take 2 g by mouth daily., Disp: , Rfl:    gabapentin (NEURONTIN) 100 MG capsule, Take 1 capsule (100 mg total) by mouth 3 (three) times daily as needed., Disp: 30 capsule, Rfl: 0   hydroxypropyl methylcellulose (ISOPTO TEARS) 2.5 % ophthalmic solution, Place 1 drop into both eyes daily as needed for dry eyes., Disp: , Rfl:    loratadine (CLARITIN) 10 MG tablet, Take 10 mg by mouth daily., Disp: , Rfl:    omeprazole (PRILOSEC) 40 MG capsule, Take 1 capsule (40 mg total) by mouth daily., Disp: 30 capsule, Rfl: 3   ondansetron (ZOFRAN) 4 MG tablet, Take 1 tablet (4 mg total) by mouth every 8 (eight) hours as needed for nausea or vomiting., Disp: 30 tablet, Rfl: 1   predniSONE (DELTASONE) 10 MG tablet, Take 2 tablets (20 mg total) by mouth daily., Disp: 15 tablet, Rfl: 0   sertraline (ZOLOFT) 25 MG tablet, Take 25 mg by mouth daily., Disp: , Rfl:    traZODone (DESYREL) 50 MG tablet, Take 1 tablet by mouth at bedtime., Disp: , Rfl: 2   valACYclovir (VALTREX) 1000 MG tablet, Take 1 tablet (1,000 mg total) by mouth 3 (three) times daily., Disp: 21 tablet, Rfl: 0   vitamin B-12 (CYANOCOBALAMIN) 1000 MCG tablet, Take 1,000 mcg by mouth daily., Disp: , Rfl:    Allergies: No Known Allergies  REVIEW OF SYSTEMS:   Review of Systems  Constitutional:  Negative for chills, fatigue and fever.   HENT:   Negative for lump/mass, mouth sores, nosebleeds, sore throat and trouble swallowing.   Eyes:  Negative for eye problems.  Respiratory:  Negative for cough and shortness of breath.   Cardiovascular:  Negative for chest pain, leg swelling and palpitations.  Gastrointestinal:  Negative for abdominal pain, constipation, diarrhea, nausea and vomiting.  Genitourinary:  Negative for bladder incontinence, difficulty urinating, dysuria, frequency, hematuria and nocturia.   Musculoskeletal:  Negative for arthralgias, back pain, flank pain, myalgias and neck pain.  Skin:  Negative for itching and rash.  Neurological:  Negative for dizziness, headaches and numbness.  Hematological:  Does not  bruise/bleed easily.  Psychiatric/Behavioral:  Negative for depression, sleep disturbance and suicidal ideas. The patient is not nervous/anxious.   All other systems reviewed and are negative.    VITALS:   There were no vitals taken for this visit.  Wt Readings from Last 3 Encounters:  10/16/19 81.6 kg (180 lb)  09/03/16 84.1 kg (185 lb 6.4 oz)  06/05/16 81.6 kg (180 lb)    There is no height or weight on file to calculate BMI.  Performance status (ECOG): {CHL ONC [id number]  PHYSICAL EXAM:   Physical Exam Vitals and nursing note reviewed. Exam conducted with a chaperone present.  Constitutional:      Appearance: Normal appearance.  Cardiovascular:     Rate and Rhythm: Normal rate and regular rhythm.     Pulses: Normal pulses.     Heart sounds: Normal heart sounds.  Pulmonary:     Effort: Pulmonary effort is normal.     Breath sounds: Normal breath sounds.  Abdominal:     Palpations: Abdomen is soft. There is no hepatomegaly, splenomegaly or mass.     Tenderness: There is no abdominal tenderness.  Musculoskeletal:     Right lower leg: No edema.     Left lower leg: No edema.  Lymphadenopathy:     Cervical: No cervical adenopathy.     Right cervical: No superficial, deep or  posterior cervical adenopathy.    Left cervical: No superficial, deep or posterior cervical adenopathy.     Upper Body:     Right upper body: No supraclavicular or axillary adenopathy.     Left upper body: No supraclavicular or axillary adenopathy.  Neurological:     General: No focal deficit present.     Mental Status: She is alert and oriented to person, place, and time.  Psychiatric:        Mood and Affect: Mood normal.        Behavior: Behavior normal.     LABS:      11/27/2006    6:13 AM 11/26/2006    5:53 AM 11/25/2006   12:25 PM  CBC  WBC 10.8  12.9    Hemoglobin 9.4  9.0  8.9 DELTA CHECK NOTED RESULT REPEATED AND VERIFIED   Hematocrit 28.7  27.8  27.5   Platelets 335  297        11/24/2006    9:51 AM 09/26/2006    9:37 PM  CMP  Glucose 100  144   BUN 15  12   Creatinine 0.75  0.67   Sodium 138  138   Potassium 3.8  3.3   Chloride 108  105   CO2 26  25   Calcium 9.4  9.4   Total Protein 7.3    Total Bilirubin 0.5    Alkaline Phos 47    AST 20    ALT 24       No results found for: "CEA1", "CEA" / No results found for: "CEA1", "CEA" No results found for: "PSA1" No results found for: "[id number]" No results found for: "CAN125"  No results found for: "TOTALPROTELP", "ALBUMINELP", "A1GS", "A2GS", "BETS", "BETA2SER", "GAMS", "MSPIKE", "SPEI" No results found for: "TIBC", "FERRITIN", "IRONPCTSAT" No results found for: "LDH"  Pathology 02/07/21: Normal smear.  No evidence of lymphoma cells.   STUDIES:   No results found.   US soft tissue head and neck 02/05/21: 1. 3 mm hypoechoic structure within the right supraclavicular fossa,  likely a subcentimeter physiologic lymph node. No concerning  features by  sonography.  2. Otherwise negative ultrasound. No other discrete mass, worrisome  adenopathy, or other structural abnormality.

## 2022-03-26 ENCOUNTER — Inpatient Hospital Stay: Payer: Medicare HMO | Attending: Hematology | Admitting: Hematology

## 2022-03-26 DIAGNOSIS — R590 Localized enlarged lymph nodes: Secondary | ICD-10-CM | POA: Insufficient documentation

## 2022-04-10 ENCOUNTER — Inpatient Hospital Stay: Payer: Medicare HMO

## 2022-04-10 ENCOUNTER — Inpatient Hospital Stay (HOSPITAL_BASED_OUTPATIENT_CLINIC_OR_DEPARTMENT_OTHER): Payer: Medicare HMO | Admitting: Hematology

## 2022-04-10 ENCOUNTER — Encounter: Payer: Self-pay | Admitting: Hematology

## 2022-04-10 VITALS — BP 120/77 | HR 99 | Temp 98.5°F | Resp 18 | Ht 68.0 in | Wt 197.7 lb

## 2022-04-10 DIAGNOSIS — R590 Localized enlarged lymph nodes: Secondary | ICD-10-CM | POA: Diagnosis not present

## 2022-04-10 LAB — CBC WITH DIFFERENTIAL/PLATELET
Abs Immature Granulocytes: 0.01 10*3/uL (ref 0.00–0.07)
Basophils Absolute: 0 10*3/uL (ref 0.0–0.1)
Basophils Relative: 1 %
Eosinophils Absolute: 0 10*3/uL (ref 0.0–0.5)
Eosinophils Relative: 1 %
HCT: 39.3 % (ref 36.0–46.0)
Hemoglobin: 12.8 g/dL (ref 12.0–15.0)
Immature Granulocytes: 0 %
Lymphocytes Relative: 25 %
Lymphs Abs: 1.6 10*3/uL (ref 0.7–4.0)
MCH: 30 pg (ref 26.0–34.0)
MCHC: 32.6 g/dL (ref 30.0–36.0)
MCV: 92.3 fL (ref 80.0–100.0)
Monocytes Absolute: 0.4 10*3/uL (ref 0.1–1.0)
Monocytes Relative: 6 %
Neutro Abs: 4.4 10*3/uL (ref 1.7–7.7)
Neutrophils Relative %: 67 %
Platelets: 212 10*3/uL (ref 150–400)
RBC: 4.26 MIL/uL (ref 3.87–5.11)
RDW: 13 % (ref 11.5–15.5)
WBC: 6.5 10*3/uL (ref 4.0–10.5)
nRBC: 0 % (ref 0.0–0.2)

## 2022-04-10 LAB — COMPREHENSIVE METABOLIC PANEL
ALT: 17 U/L (ref 0–44)
AST: 19 U/L (ref 15–41)
Albumin: 3.9 g/dL (ref 3.5–5.0)
Alkaline Phosphatase: 62 U/L (ref 38–126)
Anion gap: 10 (ref 5–15)
BUN: 22 mg/dL (ref 8–23)
CO2: 24 mmol/L (ref 22–32)
Calcium: 8.9 mg/dL (ref 8.9–10.3)
Chloride: 104 mmol/L (ref 98–111)
Creatinine, Ser: 0.85 mg/dL (ref 0.44–1.00)
GFR, Estimated: >60 mL/min (ref >60)
Glucose, Bld: 139 mg/dL — ABNORMAL HIGH (ref 70–99)
Potassium: 3.9 mmol/L (ref 3.5–5.1)
Sodium: 138 mmol/L (ref 135–145)
Total Bilirubin: 0.7 mg/dL (ref 0.3–1.2)
Total Protein: 7.5 g/dL (ref 6.5–8.1)

## 2022-04-10 LAB — LACTATE DEHYDROGENASE: LDH: 142 U/L (ref 98–192)

## 2022-04-10 NOTE — Patient Instructions (Addendum)
[facility]  Discharge Instructions  You were seen and examined today by Dr. Delton Coombes. Dr. Delton Coombes is a medical oncologist, meaning that he specializes in the treatment of cancer diagnoses. Dr. Delton Coombes discussed your past medical history, family history of cancers, and the events that led to you being here today.  You were referred to Dr. Delton Coombes due to enlarged lymph nodes.  Dr. Delton Coombes has recommended additional labs today. They do not feel like lymph nodes, but it feels like supraclavicular fat pads, but we will order a ultrasound to rule out cancer.  Follow-up as scheduled.  Thank you for choosing [facility] to provide your oncology and hematology care.   To afford each patient quality time with our provider, please arrive at least 15 minutes before your scheduled appointment time. You may need to reschedule your appointment if you arrive late (10 or more minutes). Arriving late affects you and other patients whose appointments are after yours.  Also, if you miss three or more appointments without notifying the office, you may be dismissed from the clinic at the provider's discretion.    Again, thank you for choosing [facility]  Our hope is that these requests will decrease the amount of time that you wait before being seen by our physicians.   If you have a lab appointment with the [facility] please come in thru the Main Entrance and check in at the main information desk.           _____________________________________________________________  Should you have questions after your visit to [facility], please contact our office at [phone] and follow the prompts.  Our office hours are 8:00 a.m. to 4:30 p.m. Monday - Thursday and 8:00 a.m. to 2:30 p.m. Friday.  Please note that voicemails left after 4:00 p.m. may not be returned until the following business day.  We are closed weekends  and all major holidays.  You do have access to a nurse 24-7, just call the main number to the clinic [phone] and do not press any options, hold on the line and a nurse will answer the phone.    For prescription refill requests, have your pharmacy contact our office and allow 72 hours.    Masks are optional in the cancer centers. If you would like for your care team to wear a mask while they are taking care of you, please let them know. You may have one support person who is at least 69 years old accompany you for your appointments.

## 2022-04-10 NOTE — Progress Notes (Signed)
[facility] [address] [place] [address]   Clinic Day:  04/10/2022  Referring physician: Curlene Labrum, MD  Patient Care Team: Derek Jack, MD as PCP - General (Hematology) Satira Sark, MD as Consulting Physician (Cardiology) Gala Romney Cristopher Estimable, MD as Consulting Physician (Gastroenterology) Derek Jack, MD as Medical Oncologist (Medical Oncology) Brien Mates, RN as Oncology Nurse Navigator (Medical Oncology)   ASSESSMENT & PLAN:   Assessment:  1.  Prominence in bilateral supraclavicular space: - She reports swelling in the supraclavicular area for the last 1 year. - She was evaluated at [facility] for possible lymphadenopathy. - Ultrasound (02/05/2021): 3 mm hypoechoic structure within the right suprahilar fossa, likely subcentimeter physiologic lymph node. - She denies any fevers, night sweats or weight loss.  No severe fatigue reported.  Last infection was COVID 1 year ago.  No recurrent infections noted.  No prior history of malignancies.  2.  Social/family history: - Lives by herself and is independent of ADLs and IADLs.  Today she is joined by her daughter.  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  Non-smoker but had exposure to passive smoking. - Mother and maternal aunt had breast cancers.  Brother died of cancer, patient thinks it could be colonic origin.  Plan:  1.  Prominence in bilateral supraclavicular space: - My examination did not show any lymphadenopathy or splenomegaly.  Bilateral supraclavicular fat pad. - Labs done in our office today shows normal CBC.  LDH was normal.  LFTs were normal. - Recommend ultrasound of the supraclavicular area to evaluate for lymphadenopathy underneath the fat pad. - RTC after imaging.   Orders Placed This Encounter  Procedures   US Soft Tissue Head/Neck    Standing Status:   Future    Standing Expiration Date:   04/10/2023    Order  Specific Question:   Reason for Exam (SYMPTOM  OR DIAGNOSIS REQUIRED)    Answer:   questionable supraclavicular lymphadenopathy versus fat pads    Order Specific Question:   Preferred imaging location?    Answer:   [facility]    Order Specific Question:   Release to patient    Answer:   Immediate   CBC with Differential    Standing Status:   Future    Number of Occurrences:   1    Standing Expiration Date:   04/10/2023   Comprehensive metabolic panel    Standing Status:   Future    Number of Occurrences:   1    Standing Expiration Date:   04/10/2023   Lactate dehydrogenase    Standing Status:   Future    Number of Occurrences:   1    Standing Expiration Date:   04/10/2023      I,Alexis Herring,acting as a scribe for Derek Jack, MD.,have documented all relevant documentation on the behalf of Derek Jack, MD,as directed by  Derek Jack, MD while in the presence of Derek Jack, MD.   I, Derek Jack MD, have reviewed the above documentation for accuracy and completeness, and I agree with the above.   Derek Jack, MD   3/21/20244:39 PM  CHIEF COMPLAINT/PURPOSE OF CONSULT:   Diagnosis: supraclavicular lymphadenopathy   Cancer Staging  No matching staging information was found for the patient.   Prior Therapy: none  Current Therapy:  pending work-up  HISTORY OF PRESENT ILLNESS:   Oncology History   No history exists.  Takiah is a 67 y.o. female presenting to clinic today for evaluation of supraclavicular lymphadenopathy at the request of [facility].  Today, she states that she has had intermittent lymph node swelling in the supraclavicular area for x1 year. Patient reports that she had COVID prior to the onset of her swelling. She reports occasionally feeling a tightening sensation in her neck when lying down. She reports menopausal hot flashes. She denies any fevers, drenching night sweats, or unintentional weight  loss. She denies any limiting fatigue. Her appetite level is at 100%. Her energy level is at 75%.  She denies any PMHx of cancers. She has never been a smoker but her husband did smoke so she had secondhand exposure (he passed away from cancer).  Her mother had breast cancer. Her maternal aunt had breast cancer as well. Her brother passed away from cancer- she thinks that it was stomach/colon cancer. She denies any other family history of cancer.  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 She denies any history of heart attacks.  She lives alone and is able to perform all of her ADLs independently. 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  PAST MEDICAL HISTORY:   Past Medical History: Past Medical History:  Diagnosis Date   Adult BMI 30+    Arthralgia    COPD (chronic obstructive pulmonary disease) ([facility])    Depression    Essential hypertension, benign    Hot flashes    Hypercholesteremia    Iron deficiency anemia    Menometrorrhagia   Obesity    Type 2 diabetes mellitus ([facility])     Surgical History: Past Surgical History:  Procedure Laterality Date   ABDOMINAL HYSTERECTOMY     BIOPSY  06/05/2016   Procedure: BIOPSY;  Surgeon: Daneil Dolin, MD;  [facility];  Service: Endoscopy;;  gastric   COLONOSCOPY N/A 12/06/2012   Dr. Gala Romney: tubular adenoma. Surveillance in 2019   ESOPHAGOGASTRODUODENOSCOPY N/A 06/05/2016   Dr. Gala Romney: normal esophagus, small hiatal hernia, chronic gastritis, duodenal erosions    Excision pilonidal abscess     MALONEY DILATION N/A 06/05/2016   Procedure: Venia Minks DILATION;  Surgeon: Daneil Dolin, MD;  [facility];  Service: Endoscopy;  Laterality: N/A;   TUBAL LIGATION      Social History: Social History   Socioeconomic History   Marital status: Legally Separated    Spouse name: Not on file   Number of children: Not on file   Years of education:  Not on file   Highest education level: Not on file  Occupational History   Not on file  Tobacco Use   Smoking status: Never   Smokeless tobacco: Never  Vaping Use   Vaping Use: Never used  Substance and Sexual Activity   Alcohol use: No   Drug use: No   Sexual activity: Yes    Birth control/protection: Surgical  Other Topics Concern   Not on file  Social History Narrative   Not on file   Social Determinants of Health   Financial Resource Strain: Not on file  Food Insecurity: No Food Insecurity (04/10/2022)   Hunger Vital Sign    Worried About Running Out of Food in the Last Year: Never true    Ran Out of Food in the Last Year: Never true  Transportation Needs: No Transportation Needs (04/10/2022)   PRAPARE - Transportation    Lack of  Transportation (Medical): No    Lack of Transportation (Non-Medical): No  Physical Activity: Not on file  Stress: Not on file  Social Connections: Not on file  Intimate Partner Violence: Not At Risk (04/10/2022)   Humiliation, Afraid, Rape, and Kick questionnaire    Fear of Current or Ex-Partner: No    Emotionally Abused: No    Physically Abused: No    Sexually Abused: No    Family History: Family History  Problem Relation Age of Onset   Breast cancer Mother    Colon cancer Brother 51   Breast cancer Maternal Aunt     Current Medications:  Current Outpatient Medications:    acetaminophen (TYLENOL) 500 MG tablet, Take 500 mg by mouth as needed., Disp: , Rfl:    albuterol (PROVENTIL HFA;VENTOLIN HFA) 108 (90 BASE) MCG/ACT inhaler, Inhale 1 puff into the lungs every 6 (six) hours as needed for wheezing or shortness of breath., Disp: , Rfl:    amitriptyline (ELAVIL) 25 MG tablet, Take 25 mg by mouth at bedtime., Disp: , Rfl:    aspirin 81 MG tablet, Take 81 mg by mouth. , Disp: , Rfl:    benzonatate (TESSALON) 100 MG capsule, Take 1 capsule (100 mg total) by mouth every 8 (eight) hours., Disp: 30 capsule, Rfl: 0   cholecalciferol  (VITAMIN D) 1000 units tablet, Take 1,000 Units by mouth daily., Disp: , Rfl:    fish oil-omega-3 fatty acids 1000 MG capsule, Take 2 g by mouth daily., Disp: , Rfl:    gabapentin (NEURONTIN) 100 MG capsule, Take 1 capsule (100 mg total) by mouth 3 (three) times daily as needed., Disp: 30 capsule, Rfl: 0   hydroxypropyl methylcellulose (ISOPTO TEARS) 2.5 % ophthalmic solution, Place 1 drop into both eyes daily as needed for dry eyes., Disp: , Rfl:    loratadine (CLARITIN) 10 MG tablet, Take 10 mg by mouth daily., Disp: , Rfl:    omeprazole (PRILOSEC) 40 MG capsule, Take 1 capsule (40 mg total) by mouth daily., Disp: 30 capsule, Rfl: 3   ondansetron (ZOFRAN) 4 MG tablet, Take 1 tablet (4 mg total) by mouth every 8 (eight) hours as needed for nausea or vomiting., Disp: 30 tablet, Rfl: 1   predniSONE (DELTASONE) 10 MG tablet, Take 2 tablets (20 mg total) by mouth daily., Disp: 15 tablet, Rfl: 0   sertraline (ZOLOFT) 25 MG tablet, Take 25 mg by mouth daily., Disp: , Rfl:    traZODone (DESYREL) 50 MG tablet, Take 1 tablet by mouth at bedtime., Disp: , Rfl: 2   valACYclovir (VALTREX) 1000 MG tablet, Take 1 tablet (1,000 mg total) by mouth 3 (three) times daily., Disp: 21 tablet, Rfl: 0   vitamin B-12 (CYANOCOBALAMIN) 1000 MCG tablet, Take 1,000 mcg by mouth daily., Disp: , Rfl:    Allergies: No Known Allergies  REVIEW OF SYSTEMS:   Review of Systems  Constitutional:  Negative for chills, fatigue and fever.  HENT:   Positive for lump/mass (supraclavicular lymphadenopathy). Negative for mouth sores, nosebleeds, sore throat and trouble swallowing.   Eyes:  Negative for eye problems.  Respiratory:  Positive for cough and shortness of breath.   Cardiovascular:  Negative for chest pain, leg swelling and palpitations.  Gastrointestinal:  Positive for constipation and nausea. Negative for abdominal pain, diarrhea and vomiting.  Genitourinary:  Negative for bladder incontinence, difficulty urinating,  dysuria, frequency, hematuria and nocturia.   Musculoskeletal:  Negative for arthralgias, back pain, flank pain, myalgias and neck pain.  Skin:  Negative  for itching and rash.  Neurological:  Negative for dizziness, headaches and numbness.  Hematological:  Does not bruise/bleed easily.  Psychiatric/Behavioral:  Positive for sleep disturbance. Negative for depression and suicidal ideas. The patient is not nervous/anxious.   All other systems reviewed and are negative.    VITALS:   Blood pressure 120/77, pulse 99, temperature 98.5 F (36.9 C), temperature source Oral, resp. rate 18, height 5\' 8"  (1.727 m), weight 197 lb 11.2 oz (89.7 kg), SpO2 100 %.  Wt Readings from Last 3 Encounters:  04/10/22 197 lb 11.2 oz (89.7 kg)  10/16/19 180 lb (81.6 kg)  09/03/16 185 lb 6.4 oz (84.1 kg)    Body mass index is 30.06 kg/m.  Performance status (ECOG): 1 - Symptomatic but completely ambulatory  PHYSICAL EXAM:   Physical Exam Vitals and nursing note reviewed. Exam conducted with a chaperone present.  Constitutional:      Appearance: Normal appearance.  Cardiovascular:     Rate and Rhythm: Normal rate and regular rhythm.     Pulses: Normal pulses.     Heart sounds: Normal heart sounds.  Pulmonary:     Effort: Pulmonary effort is normal.     Breath sounds: Normal breath sounds.  Abdominal:     Palpations: Abdomen is soft. There is no hepatomegaly, splenomegaly or mass.     Tenderness: There is no abdominal tenderness.  Musculoskeletal:     Right lower leg: No edema.     Left lower leg: No edema.  Lymphadenopathy:     Cervical: No cervical adenopathy.     Right cervical: No superficial, deep or posterior cervical adenopathy.    Left cervical: No superficial, deep or posterior cervical adenopathy.     Upper Body:     Right upper body: No supraclavicular or axillary adenopathy.     Left upper body: No supraclavicular or axillary adenopathy.     Comments: Supraclavicular fat pad   Neurological:     General: No focal deficit present.     Mental Status: She is alert and oriented to person, place, and time.  Psychiatric:        Mood and Affect: Mood normal.        Behavior: Behavior normal.     LABS:      Latest Ref Rng & Units 04/10/2022    1:52 PM 11/27/2006    6:13 AM 11/26/2006    5:53 AM  CBC  WBC 4.0 - 10.5 K/uL 6.5  10.8  12.9   Hemoglobin 12.0 - 15.0 g/dL 12.8  9.4  9.0   Hematocrit 36.0 - 46.0 % 39.3  28.7  27.8   Platelets 150 - 400 K/uL 212  335  297       Latest Ref Rng & Units 04/10/2022    1:52 PM 11/24/2006    9:51 AM 09/26/2006    9:37 PM  CMP  Glucose 70 - 99 mg/dL 139  100  144   BUN 8 - 23 mg/dL 22  15  12    Creatinine 0.44 - 1.00 mg/dL 0.85  0.75  0.67   Sodium 135 - 145 mmol/L 138  138  138   Potassium 3.5 - 5.1 mmol/L 3.9  3.8  3.3   Chloride 98 - 111 mmol/L 104  108  105   CO2 22 - 32 mmol/L 24  26  25    Calcium 8.9 - 10.3 mg/dL 8.9  9.4  9.4   Total Protein 6.5 - 8.1 g/dL 7.5  7.3    Total Bilirubin 0.3 - 1.2 mg/dL 0.7  0.5    Alkaline Phos 38 - 126 U/L 62  47    AST 15 - 41 U/L 19  20    ALT 0 - 44 U/L 17  24       No results found for: "CEA1", "CEA" / No results found for: "CEA1", "CEA" No results found for: "PSA1" No results found for: "[id number]" No results found for: "CAN125"  No results found for: "TOTALPROTELP", "ALBUMINELP", "A1GS", "A2GS", "BETS", "BETA2SER", "GAMS", "MSPIKE", "SPEI" No results found for: "TIBC", "FERRITIN", "IRONPCTSAT" Lab Results  Component Value Date   LDH 142 04/10/2022     STUDIES:   No results found.

## 2022-04-21 ENCOUNTER — Ambulatory Visit (HOSPITAL_COMMUNITY)
Admission: RE | Admit: 2022-04-21 | Discharge: 2022-04-21 | Disposition: A | Discharge status: Home / Self Care | Payer: Medicare HMO | Source: Ambulatory Visit | Attending: Hematology | Admitting: Hematology

## 2022-04-21 DIAGNOSIS — R590 Localized enlarged lymph nodes: Secondary | ICD-10-CM | POA: Insufficient documentation

## 2022-04-21 DIAGNOSIS — Z0389 Encounter for observation for other suspected diseases and conditions ruled out: Secondary | ICD-10-CM | POA: Diagnosis not present

## 2022-04-22 NOTE — Progress Notes (Deleted)
[facility] [place], [place] [address]   CLINIC:  Medical Oncology/Hematology  PCP:  Derek Jack, [place] [address] [phone]   REASON FOR VISIT:  Follow-up for supraclavicular lymphadenopathy  PRIOR THERAPY: None  CURRENT THERAPY: Surveillance  INTERVAL HISTORY:   Amanda [name] 67 y.o. female returns for routine follow-up of supraclavicular lymphadenopathy.  She was seen for initial consultation by Dr. Delton Coombes on 04/10/2022.  At today's visit, she reports feeling ***.  No recent hospitalizations, surgeries, or changes in baseline health status.  She continues to report  intermittent lymph node swelling in the supraclavicular area for x1 year.  *** *** Patient reports that she had COVID prior to the onset of her swelling. She reports occasionally feeling a tightening sensation in her neck when lying down. She reports menopausal hot flashes. *** She denies any associated head and neck infections.  *** She is a non-smoker. ***She denies any fevers, drenching night sweats, or unintentional weight loss. *** She denies any limiting fatigue.  She has ***% energy and ***% appetite. She endorses that she is maintaining a stable weight.   ASSESSMENT & PLAN:  1.  Prominence in bilateral supraclavicular space: - She reports swelling in the supraclavicular area for the last 1 year. - She was evaluated at [facility] in January 2023 for possible lymphadenopathy, which was felt to be most consistent with an inflammatory process (suspected residual adenopathy s/p COVID the month prior in December 2022) - Ultrasound (02/05/2021 at [facility]): 3 mm hypoechoic structure within the right suprahilar fossa, likely subcentimeter physiologic lymph node - She denies any fevers, night sweats or weight loss.  No severe fatigue reported.  *** - *** No recurrent infections noted.  No prior history of malignancies. - Ultrasound soft  tissue head/neck (04/21/2022): No sonographic correlate for patient's palpable area of concern involving bilateral supraclavicular fossa.  Specifically, no regional supraclavicular lymphadenopathy. - Physical examination (04/23/2022) did not show any lymphadenopathy or splenomegaly.  Bilateral supraclavicular fat pad prominence. - Labs done in our office on 04/10/2022 shows normal CBC.  LDH was normal.  LFTs were normal. - PLAN: No evidence of concerning lymphadenopathy at this time. - Recommend repeat CBC and LDH with RTC in 6 months for physical exam.  If no abnormalities at that time, would discharge to PCP care.  2.  Social/family history: - Lives by herself and is independent of ADLs and IADLs.  Today she is joined by her daughter.  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  Non-smoker but had exposure to passive smoking. - Mother and maternal aunt had breast cancers.  Brother died of cancer, patient thinks it could be colonic origin.  PLAN SUMMARY: >> *** >> *** >> ***   [facility] **   You were seen today by Tarri Abernethy PA-C for your ***.    *** ***  *** ***  LABS: Return in ***   OTHER TESTS: ***  MEDICATIONS: ***  FOLLOW-UP APPOINTMENT: ***     REVIEW OF SYSTEMS: ***  Review of Systems - Oncology   PHYSICAL EXAM:  ECOG PERFORMANCE STATUS: {CHL ONC ECOG [id number] *** There were no vitals filed for this visit. There were no vitals filed for this visit. Physical Exam  PAST MEDICAL/SURGICAL HISTORY:  Past Medical History:  Diagnosis Date   Adult BMI 30+    Arthralgia  COPD (chronic obstructive pulmonary disease) ([facility])    Depression    Essential hypertension, benign    Hot flashes    Hypercholesteremia    Iron deficiency anemia    Menometrorrhagia   Obesity    Type 2 diabetes mellitus ([place])    Past Surgical History:  Procedure Laterality Date    ABDOMINAL HYSTERECTOMY     BIOPSY  06/05/2016   Procedure: BIOPSY;  Surgeon: Daneil Dolin, MD;  [facility];  Service: Endoscopy;;  gastric   COLONOSCOPY N/A 12/06/2012   Dr. Gala Romney: tubular adenoma. Surveillance in 2019   ESOPHAGOGASTRODUODENOSCOPY N/A 06/05/2016   Dr. Gala Romney: normal esophagus, small hiatal hernia, chronic gastritis, duodenal erosions    Excision pilonidal abscess     MALONEY DILATION N/A 06/05/2016   Procedure: Venia Minks DILATION;  Surgeon: Daneil Dolin, MD;  [facility];  Service: Endoscopy;  Laterality: N/A;   TUBAL LIGATION      SOCIAL HISTORY:  Social History   Socioeconomic History   Marital status: Legally Separated    Spouse name: Not on file   Number of children: Not on file   Years of education: Not on file   Highest education level: Not on file  Occupational History   Not on file  Tobacco Use   Smoking status: Never   Smokeless tobacco: Never  Vaping Use   Vaping Use: Never used  Substance and Sexual Activity   Alcohol use: No   Drug use: No   Sexual activity: Yes    Birth control/protection: Surgical  Other Topics Concern   Not on file  Social History Narrative   Not on file   Social Determinants of Health   Financial Resource Strain: Not on file  Food Insecurity: No Food Insecurity (04/10/2022)   Hunger Vital Sign    Worried About Running Out of Food in the Last Year: Never true    Ran Out of Food in the Last Year: Never true  Transportation Needs: No Transportation Needs (04/10/2022)   PRAPARE - Hydrologist (Medical): No    Lack of Transportation (Non-Medical): No  Physical Activity: Not on file  Stress: Not on file  Social Connections: Not on file  Intimate Partner Violence: Not At Risk (04/10/2022)   Humiliation, Afraid, Rape, and Kick questionnaire    Fear of Current or Ex-Partner: No    Emotionally Abused: No    Physically Abused: No    Sexually Abused: No    FAMILY  HISTORY:  Family History  Problem Relation Age of Onset   Breast cancer Mother    Colon cancer Brother 20   Breast cancer Maternal Aunt     CURRENT MEDICATIONS:  Outpatient Encounter Medications as of 04/23/2022  Medication Sig   acetaminophen (TYLENOL) 500 MG tablet Take 500 mg by mouth as needed.   albuterol (PROVENTIL HFA;VENTOLIN HFA) 108 (90 BASE) MCG/ACT inhaler Inhale 1 puff into the lungs every 6 (six) hours as needed for wheezing or shortness of breath.   amitriptyline (ELAVIL) 25 MG tablet Take 25 mg by mouth at bedtime.   aspirin 81 MG tablet Take 81 mg by mouth.    benzonatate (TESSALON) 100 MG capsule Take 1 capsule (100 mg total) by mouth every 8 (eight) hours.   cholecalciferol (VITAMIN D) 1000 units tablet Take 1,000 Units by mouth daily.   fish oil-omega-3 fatty acids 1000 MG capsule Take 2 g by mouth daily.   gabapentin (NEURONTIN) 100 MG  capsule Take 1 capsule (100 mg total) by mouth 3 (three) times daily as needed.   hydroxypropyl methylcellulose (ISOPTO TEARS) 2.5 % ophthalmic solution Place 1 drop into both eyes daily as needed for dry eyes.   loratadine (CLARITIN) 10 MG tablet Take 10 mg by mouth daily.   omeprazole (PRILOSEC) 40 MG capsule Take 1 capsule (40 mg total) by mouth daily.   ondansetron (ZOFRAN) 4 MG tablet Take 1 tablet (4 mg total) by mouth every 8 (eight) hours as needed for nausea or vomiting.   predniSONE (DELTASONE) 10 MG tablet Take 2 tablets (20 mg total) by mouth daily.   sertraline (ZOLOFT) 25 MG tablet Take 25 mg by mouth daily.   traZODone (DESYREL) 50 MG tablet Take 1 tablet by mouth at bedtime.   valACYclovir (VALTREX) 1000 MG tablet Take 1 tablet (1,000 mg total) by mouth 3 (three) times daily.   vitamin B-12 (CYANOCOBALAMIN) 1000 MCG tablet Take 1,000 mcg by mouth daily.   No facility-administered encounter medications on file as of 04/23/2022.    ALLERGIES:  No Known Allergies  LABORATORY DATA:  I have reviewed the labs as listed.   CBC    Component Value Date/Time   WBC 6.5 04/10/2022 1352   RBC 4.26 04/10/2022 1352   HGB 12.8 04/10/2022 1352   HCT 39.3 04/10/2022 1352   PLT 212 04/10/2022 1352   MCV 92.3 04/10/2022 1352   MCH 30.0 04/10/2022 1352   MCHC 32.6 04/10/2022 1352   RDW 13.0 04/10/2022 1352   LYMPHSABS 1.6 04/10/2022 1352   MONOABS 0.4 04/10/2022 1352   EOSABS 0.0 04/10/2022 1352   BASOSABS 0.0 04/10/2022 1352      Latest Ref Rng & Units 04/10/2022    1:52 PM 11/24/2006    9:51 AM 09/26/2006    9:37 PM  CMP  Glucose 70 - 99 mg/dL 139  100  144   BUN 8 - 23 mg/dL 22  15  12    Creatinine 0.44 - 1.00 mg/dL 0.85  0.75  0.67   Sodium 135 - 145 mmol/L 138  138  138   Potassium 3.5 - 5.1 mmol/L 3.9  3.8  3.3   Chloride 98 - 111 mmol/L 104  108  105   CO2 22 - 32 mmol/L 24  26  25    Calcium 8.9 - 10.3 mg/dL 8.9  9.4  9.4   Total Protein 6.5 - 8.1 g/dL 7.5  7.3    Total Bilirubin 0.3 - 1.2 mg/dL 0.7  0.5    Alkaline Phos 38 - 126 U/L 62  47    AST 15 - 41 U/L 19  20    ALT 0 - 44 U/L 17  24      DIAGNOSTIC IMAGING:  I have independently reviewed the relevant imaging and discussed with the patient.   WRAP UP:  All questions were answered. The patient knows to call the clinic with any problems, questions or concerns.  Medical decision making: ***  Time spent on visit: I spent *** minutes counseling the patient face to face. The total time spent in the appointment was *** minutes and more than 50% was on counseling.  Harriett Rush, PA-C  ***

## 2022-04-23 ENCOUNTER — Inpatient Hospital Stay: Payer: Medicare HMO | Attending: Hematology | Admitting: Physician Assistant

## 2022-04-23 DIAGNOSIS — R590 Localized enlarged lymph nodes: Secondary | ICD-10-CM | POA: Insufficient documentation

## 2022-05-02 NOTE — Progress Notes (Unsigned)
[facility] [address], [place] [address]   CLINIC:  Medical Oncology/Hematology  PCP:  Doreatha Massed, MD [address] [place] [address] [phone]   REASON FOR VISIT:  Follow-up for supraclavicular lymphadenopathy  PRIOR THERAPY: None  CURRENT THERAPY: Surveillance  INTERVAL HISTORY:   [name] 67 y.o. female returns for routine follow-up of supraclavicular lymphadenopathy.  She was seen for initial consultation by Dr. Ellin Saba on 04/10/2022.  At today's visit, she reports feeling fairly well.  No recent hospitalizations, surgeries, or changes in baseline health status.  She continues to report  intermittent lymph node swelling in the supraclavicular area for x1 year.   Patient reports that she had COVID prior to the onset of her swelling. She reports occasionally feeling a tightening sensation in her neck when lying down. She reports menopausal hot flashes.  She denies any associated head and neck infections.   She is a non-smoker.  She denies any fevers, drenching night sweats, or unintentional weight loss.  She denies any limiting fatigue.  She has 50% energy and 100% appetite. She endorses that she is maintaining a stable weight.  ASSESSMENT & PLAN:  .  Prominence in bilateral supraclavicular space: - She reports swelling in the supraclavicular area for the last 1 year. - She was evaluated at [facility] in January 2023 for possible lymphadenopathy, which was felt to be most consistent with an inflammatory process (suspected residual adenopathy s/p COVID the month prior in December 2022) - Ultrasound (02/05/2021 at [facility]): 3 mm hypoechoic structure within the right suprahilar fossa, likely subcentimeter physiologic lymph node - She denies any fevers, night sweats or weight loss.  No severe fatigue reported.   - No recurrent infections noted.  No prior history of malignancies. - Ultrasound soft tissue head/neck (04/21/2022):  No sonographic correlate for patient's palpable area of concern involving bilateral supraclavicular fossa.  Specifically, no regional supraclavicular lymphadenopathy. - Physical examination (05/05/22) did not show any lymphadenopathy or splenomegaly.  Bilateral supraclavicular fat pad prominence. - Labs done in our office on 04/10/2022 shows normal CBC.  LDH was normal.  LFTs were normal. - PLAN: No evidence of concerning lymphadenopathy at this time.  We will discharge to PCP care, but patient can be referred back to [place] as needed in the future.  2.  Social/family history: - Lives by herself and is independent of ADLs and IADLs.  Today she is joined by her daughter.  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  Non-smoker but had exposure to passive smoking. - Mother and maternal aunt had breast cancers.  Brother died of cancer, patient thinks it could be colonic origin.  PLAN SUMMARY: >> Discharge to PCP - can follow-up as needed     REVIEW OF SYSTEMS:   Review of Systems  Constitutional:  Positive for fatigue. Negative for appetite change, chills, diaphoresis, fever and unexpected weight change.  HENT:   Negative for lump/mass and nosebleeds.   Eyes:  Negative for eye problems.  Respiratory:  Positive for shortness of breath. Negative for cough and hemoptysis.   Cardiovascular:  Positive for palpitations. Negative for chest pain and leg swelling.  Gastrointestinal:  Positive for constipation. Negative for abdominal pain, blood in stool, diarrhea, nausea and vomiting.  Genitourinary:  Negative for hematuria.   Skin: Negative.   Neurological:  Negative for dizziness, headaches and light-headedness.  Hematological:  Does not bruise/bleed easily.     PHYSICAL EXAM:  ECOG PERFORMANCE STATUS: 0 - Asymptomatic  There were no vitals filed for this visit. There were no vitals filed for this visit. Physical Exam Constitutional:      Appearance: Normal appearance. She is normal  weight.  Cardiovascular:     Heart sounds: Normal heart sounds.  Pulmonary:     Breath sounds: Normal breath sounds.  Neurological:     General: No focal deficit present.     Mental Status: Mental status is at baseline.  Psychiatric:        Behavior: Behavior normal. Behavior is cooperative.     PAST MEDICAL/SURGICAL HISTORY:  Past Medical History:  Diagnosis Date   Adult BMI 30+    Arthralgia    COPD (chronic obstructive pulmonary disease) (HCC)    Depression    Essential hypertension, benign    Hot flashes    Hypercholesteremia    Iron deficiency anemia    Menometrorrhagia   Obesity    Type 2 diabetes mellitus (HCC)    Past Surgical History:  Procedure Laterality Date   ABDOMINAL HYSTERECTOMY     BIOPSY  06/05/2016   Procedure: BIOPSY;  Surgeon: Corbin Ade, MD;  [facility];  Service: Endoscopy;;  gastric   COLONOSCOPY N/A 12/06/2012   Dr. Jena Gauss: tubular adenoma. Surveillance in 2019   ESOPHAGOGASTRODUODENOSCOPY N/A 06/05/2016   Dr. Jena Gauss: normal esophagus, small hiatal hernia, chronic gastritis, duodenal erosions    Excision pilonidal abscess     MALONEY DILATION N/A 06/05/2016   Procedure: Elease Hashimoto DILATION;  Surgeon: Corbin Ade, MD;  [facility];  Service: Endoscopy;  Laterality: N/A;   TUBAL LIGATION      SOCIAL HISTORY:  Social History   Socioeconomic History   Marital status: Legally Separated    Spouse name: Not on file   Number of children: Not on file   Years of education: Not on file   Highest education level: Not on file  Occupational History   Not on file  Tobacco Use   Smoking status: Never   Smokeless tobacco: Never  Vaping Use   Vaping Use: Never used  Substance and Sexual Activity   Alcohol use: No   Drug use: No   Sexual activity: Yes    Birth control/protection: Surgical  Other Topics Concern   Not on file  Social History Narrative   Not on file   Social Determinants of Health   Financial Resource  Strain: Not on file  Food Insecurity: No Food Insecurity (04/10/2022)   Hunger Vital Sign    Worried About Running Out of Food in the Last Year: Never true    Ran Out of Food in the Last Year: Never true  Transportation Needs: No Transportation Needs (04/10/2022)   PRAPARE - Administrator, Civil Service (Medical): No    Lack of Transportation (Non-Medical): No  Physical Activity: Not on file  Stress: Not on file  Social Connections: Not on file  Intimate Partner Violence: Not At Risk (04/10/2022)   Humiliation, Afraid, Rape, and Kick questionnaire    Fear of Current or Ex-Partner: No    Emotionally Abused: No    Physically Abused: No    Sexually Abused: No    FAMILY HISTORY:  Family History  Problem Relation Age of Onset   Breast cancer Mother    Colon cancer Brother 67   Breast cancer Maternal Aunt     CURRENT MEDICATIONS:  Outpatient Encounter Medications as of 05/05/2022  Medication Sig  acetaminophen (TYLENOL) 500 MG tablet Take 500 mg by mouth as needed.   albuterol (PROVENTIL HFA;VENTOLIN HFA) 108 (90 BASE) MCG/ACT inhaler Inhale 1 puff into the lungs every 6 (six) hours as needed for wheezing or shortness of breath.   amitriptyline (ELAVIL) 25 MG tablet Take 25 mg by mouth at bedtime.   aspirin 81 MG tablet Take 81 mg by mouth.    benzonatate (TESSALON) 100 MG capsule Take 1 capsule (100 mg total) by mouth every 8 (eight) hours.   cholecalciferol (VITAMIN D) 1000 units tablet Take 1,000 Units by mouth daily.   fish oil-omega-3 fatty acids 1000 MG capsule Take 2 g by mouth daily.   gabapentin (NEURONTIN) 100 MG capsule Take 1 capsule (100 mg total) by mouth 3 (three) times daily as needed.   hydroxypropyl methylcellulose (ISOPTO TEARS) 2.5 % ophthalmic solution Place 1 drop into both eyes daily as needed for dry eyes.   loratadine (CLARITIN) 10 MG tablet Take 10 mg by mouth daily.   omeprazole (PRILOSEC) 40 MG capsule Take 1 capsule (40 mg total) by mouth  daily.   ondansetron (ZOFRAN) 4 MG tablet Take 1 tablet (4 mg total) by mouth every 8 (eight) hours as needed for nausea or vomiting.   predniSONE (DELTASONE) 10 MG tablet Take 2 tablets (20 mg total) by mouth daily.   sertraline (ZOLOFT) 25 MG tablet Take 25 mg by mouth daily.   traZODone (DESYREL) 50 MG tablet Take 1 tablet by mouth at bedtime.   valACYclovir (VALTREX) 1000 MG tablet Take 1 tablet (1,000 mg total) by mouth 3 (three) times daily.   vitamin B-12 (CYANOCOBALAMIN) 1000 MCG tablet Take 1,000 mcg by mouth daily.   No facility-administered encounter medications on file as of 05/05/2022.    ALLERGIES:  No Known Allergies  LABORATORY DATA:  I have reviewed the labs as listed.  CBC    Component Value Date/Time   WBC 6.5 04/10/2022 1352   RBC 4.26 04/10/2022 1352   HGB 12.8 04/10/2022 1352   HCT 39.3 04/10/2022 1352   PLT 212 04/10/2022 1352   MCV 92.3 04/10/2022 1352   MCH 30.0 04/10/2022 1352   MCHC 32.6 04/10/2022 1352   RDW 13.0 04/10/2022 1352   LYMPHSABS 1.6 04/10/2022 1352   MONOABS 0.4 04/10/2022 1352   EOSABS 0.0 04/10/2022 1352   BASOSABS 0.0 04/10/2022 1352      Latest Ref Rng & Units 04/10/2022    1:52 PM 11/24/2006    9:51 AM 09/26/2006    9:37 PM  CMP  Glucose 70 - 99 mg/dL 161  096  045   BUN 8 - 23 mg/dL Creatinine 0.44 - 1.00 mg/dL 4.09  8.11  9.14   Sodium 135 - 145 mmol/L 138  138  138   Potassium 3.5 - 5.1 mmol/L 3.9  3.8  3.3   Chloride 98 - 111 mmol/L 104  108  105   CO2 22 - 32 mmol/L Calcium 8.9 - 10.3 mg/dL 8.9  9.4  9.4   Total Protein 6.5 - 8.1 g/dL 7.5  7.3    Total Bilirubin 0.3 - 1.2 mg/dL 0.7  0.5    Alkaline Phos 38 - 126 U/L 62  47    AST 15 - 41 U/L 19  20    ALT 0 - 44 U/L 17  24      DIAGNOSTIC IMAGING:  I have independently reviewed the  relevant imaging and discussed with the patient.   WRAP UP:  All questions were answered. The patient knows to call the clinic with any problems, questions or  concerns.  Medical decision making: Low  Time spent on visit: I spent 20 minutes counseling the patient face to face. The total time spent in the appointment was 30 minutes and more than 50% was on counseling.  Carnella Guadalajara, PA-C  05/05/22 9:53 AM

## 2022-05-05 ENCOUNTER — Inpatient Hospital Stay (HOSPITAL_BASED_OUTPATIENT_CLINIC_OR_DEPARTMENT_OTHER): Payer: Medicare HMO | Admitting: Physician Assistant

## 2022-05-05 VITALS — BP 133/86 | HR 87 | Temp 98.0°F | Resp 18 | Ht 68.0 in | Wt 194.0 lb

## 2022-05-05 DIAGNOSIS — R590 Localized enlarged lymph nodes: Secondary | ICD-10-CM | POA: Diagnosis not present

## 2022-06-19 DIAGNOSIS — R030 Elevated blood-pressure reading, without diagnosis of hypertension: Secondary | ICD-10-CM | POA: Diagnosis not present

## 2022-06-19 DIAGNOSIS — Z6829 Body mass index (BMI) 29.0-29.9, adult: Secondary | ICD-10-CM | POA: Diagnosis not present

## 2022-06-19 DIAGNOSIS — B029 Zoster without complications: Secondary | ICD-10-CM | POA: Diagnosis not present

## 2022-08-01 DIAGNOSIS — D649 Anemia, unspecified: Secondary | ICD-10-CM | POA: Diagnosis not present

## 2022-08-01 DIAGNOSIS — E7849 Other hyperlipidemia: Secondary | ICD-10-CM | POA: Diagnosis not present

## 2022-08-01 DIAGNOSIS — Z0001 Encounter for general adult medical examination with abnormal findings: Secondary | ICD-10-CM | POA: Diagnosis not present

## 2022-08-01 DIAGNOSIS — I10 Essential (primary) hypertension: Secondary | ICD-10-CM | POA: Diagnosis not present

## 2022-09-02 DIAGNOSIS — M25572 Pain in left ankle and joints of left foot: Secondary | ICD-10-CM | POA: Diagnosis not present

## 2022-09-02 DIAGNOSIS — I10 Essential (primary) hypertension: Secondary | ICD-10-CM | POA: Diagnosis not present

## 2022-09-02 DIAGNOSIS — Z6829 Body mass index (BMI) 29.0-29.9, adult: Secondary | ICD-10-CM | POA: Diagnosis not present

## 2022-09-02 DIAGNOSIS — E663 Overweight: Secondary | ICD-10-CM | POA: Diagnosis not present

## 2022-09-08 DIAGNOSIS — Z1231 Encounter for screening mammogram for malignant neoplasm of breast: Secondary | ICD-10-CM | POA: Diagnosis not present

## 2022-09-15 DIAGNOSIS — J449 Chronic obstructive pulmonary disease, unspecified: Secondary | ICD-10-CM | POA: Diagnosis not present

## 2022-09-15 DIAGNOSIS — Z09 Encounter for follow-up examination after completed treatment for conditions other than malignant neoplasm: Secondary | ICD-10-CM | POA: Diagnosis not present

## 2022-09-15 DIAGNOSIS — E119 Type 2 diabetes mellitus without complications: Secondary | ICD-10-CM | POA: Diagnosis not present

## 2022-09-15 DIAGNOSIS — Z86010 Personal history of colonic polyps: Secondary | ICD-10-CM | POA: Diagnosis not present

## 2022-11-06 DIAGNOSIS — E860 Dehydration: Secondary | ICD-10-CM | POA: Diagnosis not present

## 2022-11-06 DIAGNOSIS — R030 Elevated blood-pressure reading, without diagnosis of hypertension: Secondary | ICD-10-CM | POA: Diagnosis not present

## 2022-11-06 DIAGNOSIS — A084 Viral intestinal infection, unspecified: Secondary | ICD-10-CM | POA: Diagnosis not present

## 2022-11-06 DIAGNOSIS — Z6829 Body mass index (BMI) 29.0-29.9, adult: Secondary | ICD-10-CM | POA: Diagnosis not present

## 2022-12-24 ENCOUNTER — Other Ambulatory Visit (HOSPITAL_COMMUNITY): Payer: Self-pay | Admitting: Family Medicine

## 2022-12-24 DIAGNOSIS — R109 Unspecified abdominal pain: Secondary | ICD-10-CM | POA: Diagnosis not present

## 2022-12-24 DIAGNOSIS — Z139 Encounter for screening, unspecified: Secondary | ICD-10-CM

## 2022-12-24 DIAGNOSIS — F450 Somatization disorder: Secondary | ICD-10-CM | POA: Diagnosis not present

## 2022-12-31 ENCOUNTER — Other Ambulatory Visit (HOSPITAL_COMMUNITY): Payer: Medicare HMO

## 2023-01-29 DIAGNOSIS — M810 Age-related osteoporosis without current pathological fracture: Secondary | ICD-10-CM | POA: Diagnosis not present

## 2023-01-29 DIAGNOSIS — Z780 Asymptomatic menopausal state: Secondary | ICD-10-CM | POA: Diagnosis not present

## 2023-03-06 DIAGNOSIS — E7849 Other hyperlipidemia: Secondary | ICD-10-CM | POA: Diagnosis not present

## 2023-03-06 DIAGNOSIS — R1013 Epigastric pain: Secondary | ICD-10-CM | POA: Diagnosis not present

## 2023-03-06 DIAGNOSIS — R350 Frequency of micturition: Secondary | ICD-10-CM | POA: Diagnosis not present

## 2023-03-06 DIAGNOSIS — R634 Abnormal weight loss: Secondary | ICD-10-CM | POA: Diagnosis not present

## 2023-03-06 DIAGNOSIS — R5383 Other fatigue: Secondary | ICD-10-CM | POA: Diagnosis not present

## 2023-03-06 DIAGNOSIS — Z113 Encounter for screening for infections with a predominantly sexual mode of transmission: Secondary | ICD-10-CM | POA: Diagnosis not present

## 2023-03-06 DIAGNOSIS — R300 Dysuria: Secondary | ICD-10-CM | POA: Diagnosis not present

## 2023-03-06 DIAGNOSIS — N898 Other specified noninflammatory disorders of vagina: Secondary | ICD-10-CM | POA: Diagnosis not present

## 2023-03-06 DIAGNOSIS — E782 Mixed hyperlipidemia: Secondary | ICD-10-CM | POA: Diagnosis not present

## 2023-03-25 DIAGNOSIS — R1013 Epigastric pain: Secondary | ICD-10-CM | POA: Diagnosis not present

## 2023-04-01 DIAGNOSIS — R1013 Epigastric pain: Secondary | ICD-10-CM | POA: Diagnosis not present

## 2023-04-12 DIAGNOSIS — Z79899 Other long term (current) drug therapy: Secondary | ICD-10-CM | POA: Diagnosis not present

## 2023-04-12 DIAGNOSIS — R1013 Epigastric pain: Secondary | ICD-10-CM | POA: Diagnosis not present

## 2023-04-12 DIAGNOSIS — E785 Hyperlipidemia, unspecified: Secondary | ICD-10-CM | POA: Diagnosis not present

## 2023-04-12 DIAGNOSIS — K828 Other specified diseases of gallbladder: Secondary | ICD-10-CM | POA: Diagnosis not present

## 2023-04-12 DIAGNOSIS — K5730 Diverticulosis of large intestine without perforation or abscess without bleeding: Secondary | ICD-10-CM | POA: Diagnosis not present

## 2023-04-12 DIAGNOSIS — R1010 Upper abdominal pain, unspecified: Secondary | ICD-10-CM | POA: Diagnosis not present

## 2023-04-12 DIAGNOSIS — I10 Essential (primary) hypertension: Secondary | ICD-10-CM | POA: Diagnosis not present

## 2023-04-12 DIAGNOSIS — K8050 Calculus of bile duct without cholangitis or cholecystitis without obstruction: Secondary | ICD-10-CM | POA: Diagnosis not present

## 2023-04-13 DIAGNOSIS — K838 Other specified diseases of biliary tract: Secondary | ICD-10-CM | POA: Diagnosis not present

## 2023-04-13 DIAGNOSIS — R748 Abnormal levels of other serum enzymes: Secondary | ICD-10-CM | POA: Diagnosis not present

## 2023-04-13 DIAGNOSIS — K8010 Calculus of gallbladder with chronic cholecystitis without obstruction: Secondary | ICD-10-CM | POA: Diagnosis not present

## 2023-04-13 DIAGNOSIS — J45909 Unspecified asthma, uncomplicated: Secondary | ICD-10-CM | POA: Diagnosis not present

## 2023-04-13 DIAGNOSIS — I10 Essential (primary) hypertension: Secondary | ICD-10-CM | POA: Diagnosis not present

## 2023-04-13 DIAGNOSIS — R1013 Epigastric pain: Secondary | ICD-10-CM | POA: Diagnosis not present

## 2023-04-13 DIAGNOSIS — Z7951 Long term (current) use of inhaled steroids: Secondary | ICD-10-CM | POA: Diagnosis not present

## 2023-04-13 DIAGNOSIS — K8050 Calculus of bile duct without cholangitis or cholecystitis without obstruction: Secondary | ICD-10-CM | POA: Diagnosis not present

## 2023-04-13 DIAGNOSIS — K8020 Calculus of gallbladder without cholecystitis without obstruction: Secondary | ICD-10-CM | POA: Diagnosis not present

## 2023-04-13 DIAGNOSIS — K660 Peritoneal adhesions (postprocedural) (postinfection): Secondary | ICD-10-CM | POA: Diagnosis not present

## 2023-04-13 DIAGNOSIS — R109 Unspecified abdominal pain: Secondary | ICD-10-CM | POA: Diagnosis not present

## 2023-04-13 DIAGNOSIS — Z79899 Other long term (current) drug therapy: Secondary | ICD-10-CM | POA: Diagnosis not present

## 2023-04-14 DIAGNOSIS — K8050 Calculus of bile duct without cholangitis or cholecystitis without obstruction: Secondary | ICD-10-CM | POA: Diagnosis not present

## 2023-04-15 DIAGNOSIS — K8050 Calculus of bile duct without cholangitis or cholecystitis without obstruction: Secondary | ICD-10-CM | POA: Diagnosis not present

## 2023-04-15 DIAGNOSIS — K8010 Calculus of gallbladder with chronic cholecystitis without obstruction: Secondary | ICD-10-CM | POA: Diagnosis not present

## 2023-04-15 DIAGNOSIS — K8070 Calculus of gallbladder and bile duct without cholecystitis without obstruction: Secondary | ICD-10-CM | POA: Diagnosis not present

## 2023-04-15 DIAGNOSIS — Z9889 Other specified postprocedural states: Secondary | ICD-10-CM | POA: Diagnosis not present

## 2023-04-16 DIAGNOSIS — K8050 Calculus of bile duct without cholangitis or cholecystitis without obstruction: Secondary | ICD-10-CM | POA: Diagnosis not present

## 2023-05-25 DIAGNOSIS — R42 Dizziness and giddiness: Secondary | ICD-10-CM | POA: Diagnosis not present

## 2023-05-25 DIAGNOSIS — R5383 Other fatigue: Secondary | ICD-10-CM | POA: Diagnosis not present

## 2023-05-25 DIAGNOSIS — Z1329 Encounter for screening for other suspected endocrine disorder: Secondary | ICD-10-CM | POA: Diagnosis not present

## 2023-05-25 DIAGNOSIS — E7849 Other hyperlipidemia: Secondary | ICD-10-CM | POA: Diagnosis not present

## 2023-05-27 DIAGNOSIS — K219 Gastro-esophageal reflux disease without esophagitis: Secondary | ICD-10-CM | POA: Diagnosis not present

## 2023-05-27 DIAGNOSIS — E7849 Other hyperlipidemia: Secondary | ICD-10-CM | POA: Diagnosis not present

## 2023-05-27 DIAGNOSIS — I10 Essential (primary) hypertension: Secondary | ICD-10-CM | POA: Diagnosis not present

## 2023-05-27 DIAGNOSIS — G4700 Insomnia, unspecified: Secondary | ICD-10-CM | POA: Diagnosis not present

## 2023-05-27 DIAGNOSIS — Z6828 Body mass index (BMI) 28.0-28.9, adult: Secondary | ICD-10-CM | POA: Diagnosis not present

## 2023-05-27 DIAGNOSIS — E782 Mixed hyperlipidemia: Secondary | ICD-10-CM | POA: Diagnosis not present

## 2023-05-29 DIAGNOSIS — I10 Essential (primary) hypertension: Secondary | ICD-10-CM | POA: Diagnosis not present

## 2023-05-29 DIAGNOSIS — J449 Chronic obstructive pulmonary disease, unspecified: Secondary | ICD-10-CM | POA: Diagnosis not present

## 2023-05-29 DIAGNOSIS — E119 Type 2 diabetes mellitus without complications: Secondary | ICD-10-CM | POA: Diagnosis not present

## 2023-05-29 DIAGNOSIS — R112 Nausea with vomiting, unspecified: Secondary | ICD-10-CM | POA: Diagnosis not present

## 2023-05-29 DIAGNOSIS — Z8673 Personal history of transient ischemic attack (TIA), and cerebral infarction without residual deficits: Secondary | ICD-10-CM | POA: Diagnosis not present

## 2023-05-29 DIAGNOSIS — Z90710 Acquired absence of both cervix and uterus: Secondary | ICD-10-CM | POA: Diagnosis not present

## 2023-05-29 DIAGNOSIS — E785 Hyperlipidemia, unspecified: Secondary | ICD-10-CM | POA: Diagnosis not present

## 2023-05-29 DIAGNOSIS — R1013 Epigastric pain: Secondary | ICD-10-CM | POA: Diagnosis not present

## 2023-05-29 DIAGNOSIS — K5732 Diverticulitis of large intestine without perforation or abscess without bleeding: Secondary | ICD-10-CM | POA: Diagnosis not present

## 2023-05-29 DIAGNOSIS — K219 Gastro-esophageal reflux disease without esophagitis: Secondary | ICD-10-CM | POA: Diagnosis not present

## 2023-05-29 DIAGNOSIS — R1033 Periumbilical pain: Secondary | ICD-10-CM | POA: Diagnosis not present

## 2023-05-30 DIAGNOSIS — K219 Gastro-esophageal reflux disease without esophagitis: Secondary | ICD-10-CM | POA: Diagnosis not present

## 2023-05-30 DIAGNOSIS — J449 Chronic obstructive pulmonary disease, unspecified: Secondary | ICD-10-CM | POA: Diagnosis not present

## 2023-05-30 DIAGNOSIS — K5732 Diverticulitis of large intestine without perforation or abscess without bleeding: Secondary | ICD-10-CM | POA: Diagnosis not present

## 2023-05-30 DIAGNOSIS — I10 Essential (primary) hypertension: Secondary | ICD-10-CM | POA: Diagnosis not present

## 2023-06-08 NOTE — Progress Notes (Signed)
 Referring Provider: Jacquetta Mattocks, DO  Primary Care Physician:  Paulett Boros, MD Primary Gastroenterologist:  Dr. Riley Cheadle  Chief Complaint  Patient presents with   Follow-up    Hospital follow up    HPI:   Amanda [name] is a 68 y.o. female presenting today at the request of Vendura, Khoury W, DO for diverticulitis, epigastric pain, personal history of colon polyps.  He has history of choledocholithiasis s/p ERCP at [facility] on 04/14/2023 where he had sphincterotomy and balloon sweeping with stone removal and stent placement.  Recommended repeat ERCP in 3 months.  He has follow-up with [facility] on 07/16/2023.  Underwent laparoscopic cholecystectomy at [facility] on 04/15/2023.   Patient was admitted from 05/29/23 - 05/30/2023 at [facility] after presenting with abdominal pain, nausea, vomiting.  She had no significant laboratory abnormalities.  CT A/P with IV contrast showed uncomplicated diverticulitis of the upper sigmoid colon.  She was initially given an empiric dose of Zosyn in the ER, but was treated ultimately with Cipro  and Flagyl for diverticulitis for 7 days.  Today: Diverticulitis: Took Cipro  and Flagyl x 4 days. Developed pain in right ring finger and right shoulder, so she stopped taking her medications.  Shoulder pain has resolved.  Still some mild pain in her finger.  Prior abdominal pain has resolved.  States it was suprapubic.  No nausea or vomiting.  No BRBPR or melena.  Used to have issues with constipation.  Since having her gallbladder removed, she is having issues with frequent loose stools.  Having 2 or 3/day, but this is very bothersome for her.  Last colonoscopy in 2014 with tubular adenoma removed.  Recommended surveillance in 2019.    Epigastric pain: Reports she was previously having some epigastric abdominal pain, but this resolved after gallbladder issues were taking care of.  Her PCP had started her on a "stomach  pill" prior to realizing her gallbladder was issue and she wants to stop this medication.  Denies any issues with heartburn or dysphagia.      Past Medical History:  Diagnosis Date   Adult BMI 30+    Arthralgia    COPD (chronic obstructive pulmonary disease) (HCC)    Depression    Essential hypertension, benign    Hot flashes    Hypercholesteremia    Iron deficiency anemia    Menometrorrhagia   Obesity    Type 2 diabetes mellitus (HCC)     Past Surgical History:  Procedure Laterality Date   ABDOMINAL HYSTERECTOMY     BIOPSY  06/05/2016   Procedure: BIOPSY;  Surgeon: Suzette Espy, MD;  [facility];  Service: Endoscopy;;  gastric   COLONOSCOPY N/A 12/06/2012   Dr. Riley Cheadle: tubular adenoma. Surveillance in 2019   ESOPHAGOGASTRODUODENOSCOPY N/A 06/05/2016   Dr. Riley Cheadle: normal esophagus, small hiatal hernia, chronic gastritis, duodenal erosions    Excision pilonidal abscess     MALONEY DILATION N/A 06/05/2016   Procedure: Londa Rival DILATION;  Surgeon: Suzette Espy, MD;  [facility];  Service: Endoscopy;  Laterality: N/A;   TUBAL LIGATION      Current Outpatient Medications  Medication Sig Dispense Refill   acetaminophen  (TYLENOL ) 500 MG tablet Take 500 mg by mouth as needed.     albuterol (PROVENTIL HFA;VENTOLIN HFA) 108 (90 BASE) MCG/ACT inhaler Inhale 1 puff into the lungs every 6 (six) hours as needed for wheezing or shortness of breath.     amitriptyline (ELAVIL) 25  MG tablet Take 25 mg by mouth at bedtime.     aspirin 81 MG tablet Take 81 mg by mouth.      cholecalciferol (VITAMIN D) 1000 units tablet Take 1,000 Units by mouth daily.     colestipol (COLESTID) 1 g tablet Take 2 tablets (2 g total) by mouth daily. 60 tablet 3   fish oil-omega-3 fatty acids 1000 MG capsule Take 2 g by mouth daily.     gabapentin  (NEURONTIN ) 100 MG capsule Take 1 capsule (100 mg total) by mouth 3 (three) times daily as needed. 30 capsule 0   loratadine (CLARITIN) 10 MG  tablet Take 10 mg by mouth daily.     ondansetron  (ZOFRAN ) 4 MG tablet Take 1 tablet (4 mg total) by mouth every 8 (eight) hours as needed for nausea or vomiting. 30 tablet 1   sertraline (ZOLOFT) 25 MG tablet Take 25 mg by mouth daily.     simvastatin (ZOCOR) 20 MG tablet Take 20 mg by mouth daily.     traZODone (DESYREL) 50 MG tablet Take 1 tablet by mouth at bedtime.  2   valACYclovir  (VALTREX ) 1000 MG tablet Take 1 tablet (1,000 mg total) by mouth 3 (three) times daily. 21 tablet 0   vitamin B-12 (CYANOCOBALAMIN) 1000 MCG tablet Take 1,000 mcg by mouth daily.     hydroxypropyl methylcellulose (ISOPTO TEARS) 2.5 % ophthalmic solution Place 1 drop into both eyes daily as needed for dry eyes. (Patient not taking: Reported on 06/10/2023)     No current facility-administered medications for this visit.    Allergies as of 06/10/2023   (No Known Allergies)    Family History  Problem Relation Age of Onset   Breast cancer Mother    Colon cancer Brother 26   Breast cancer Maternal Aunt     Social History   Socioeconomic History   Marital status: Legally Separated    Spouse name: Not on file   Number of children: Not on file   Years of education: Not on file   Highest education level: Not on file  Occupational History   Not on file  Tobacco Use   Smoking status: Never   Smokeless tobacco: Never  Vaping Use   Vaping status: Never Used  Substance and Sexual Activity   Alcohol use: No   Drug use: No   Sexual activity: Yes    Birth control/protection: Surgical  Other Topics Concern   Not on file  Social History Narrative   Not on file   Social Drivers of Health   Financial Resource Strain: Low Risk  (05/30/2023)   Received from [facility]   Overall Financial Resource Strain (CARDIA)    Difficulty of Paying Living Expenses: Not hard at all  Food Insecurity: No Food Insecurity (05/30/2023)   Received from [facility]   Hunger Vital Sign    Worried About Running Out  of Food in the Last Year: Never true    Ran Out of Food in the Last Year: Never true  Transportation Needs: No Transportation Needs (05/30/2023)   Received from [facility] - Transportation    Lack of Transportation (Medical): No    Lack of Transportation (Non-Medical): No  Physical Activity: Insufficiently Active (02/14/2021)   Received from [facility], [facility]   Exercise Vital Sign    Days of Exercise per Week: 3 days    Minutes of Exercise per Session: 20 min  Stress: Stress Concern  Present (02/14/2021)   Received from [facility], [facility]   [facility] - Occupational Stress Questionnaire    Feeling of Stress : To some extent  Social Connections: Moderately Isolated (02/14/2021)   Received from [facility], [facility]   Social Connection and Isolation Panel [NHANES]    Frequency of Communication with Friends and Family: Three times a week    Frequency of Social Gatherings with Friends and Family: Once a week    Attends Religious Services: More than 4 times per year    Active Member of [employer] or Organizations: No    Attends Banker Meetings: Never    Marital Status: Widowed  Intimate Partner Violence: Not At Risk (05/30/2023)   Received from [facility]   Humiliation, Afraid, Rape, and Kick questionnaire    Fear of Current or Ex-Partner: No    Emotionally Abused: No    Physically Abused: No    Sexually Abused: No    Review of Systems: Gen: Denies any fever, chills, cold or flulike symptoms, presyncope, syncope. CV: Denies chest pain, heart palpitations. Resp: Denies shortness of breath, cough. GI: See HPI GU : Denies urinary burning, urinary frequency, urinary hesitancy MS: Denies joint pain. Derm: Denies rash. Psych: Denies depression, anxiety.  Reports she is better sometimes. Heme: See HPI  Physical Exam: BP 136/84 (BP Location: Right Arm, Patient Position: Sitting, Cuff Size:  Large)   Pulse 90   Temp 97.8 F (36.6 C) (Temporal)   Ht 5\' 7"  (1.702 m)   Wt 186 lb 12.8 oz (84.7 kg)   BMI 29.26 kg/m  General:   Alert and oriented. Pleasant and cooperative. Well-nourished and well-developed.  Head:  Normocephalic and atraumatic. Eyes:  Without icterus, sclera clear and conjunctiva pink.  Ears:  Normal auditory acuity. Lungs:  Clear to auscultation bilaterally. No wheezes, rales, or rhonchi. No distress.  Heart:  S1, S2 present without murmurs appreciated.  Abdomen:  +BS, soft, non-tender and non-distended. No HSM noted. No guarding or rebound. No masses appreciated.  Rectal:  Deferred  Msk:  Symmetrical without gross deformities. Normal posture. Extremities:  Without edema. Neurologic:  Alert and  oriented x4;  grossly normal neurologically. Skin:  Intact without significant lesions or rashes. Psych: Normal mood and affect.    Assessment:  68 year old female with history of COPD, HTN, HLP, diet-controlled diabetes, adenomatous colon polyp, presenting today for follow-up after recent episode of diverticulitis.  History of diverticulitis: Diagnosed with uncomplicated sigmoid diverticulitis on 05/29/2023.  She was treated with antibiotics, but only took 4 out of 7 days due to developing pain in her right shoulder and right ring finger.  Query whether this may have been related to Cipro .  Nonetheless, abdominal pain has resolved.  She is overdue for colonoscopy which will need to be scheduled 6 weeks as this is also indicated to rule out underlying malignancy as etiology of diverticulitis.   Bile salt diarrhea: Reports history of constipation, but now with diarrhea since cholecystectomy in March 2025.  I will start her on Colestid as she is quite bothered by her symptoms.  History of adenomatous colon polyp: Overdue for surveillance. Last colonoscopy in 2014 with tubular adenoma removed.  Recommended surveillance in 2019. History significant for brother with colon  cancer.    Epigastric pain: Resolved following ERCP and cholecystectomy.  Patient states she was previously started on omeprazole  and famotidine prior to diagnosis of gallbladder/biliary issues and would like to stop  these medications as she has no chronic issues with upper abdominal pain, heartburn, dysphagia.  I think this would be fine. She was advised to monitor for any recurrent symptoms.  Notably, she is scheduled to follow-up with [facility] for repeat ERCP with stent removal in June.    Plan:  Proceed with colonoscopy with propofol by Dr. Riley Cheadle in 6+ weeks. The risks, benefits, and alternatives have been discussed with the patient in detail. The patient states understanding and desires to proceed.  ASA 3, but ok to schedule in ASA 2 room.  Start Colestid 2 g daily for bile salt diarrhea.  Recommended holding if she develops constipation. Okay to stop omeprazole  and famotidine.  Monitor for upper abdominal pain, heartburn. Follow-up after colonoscopy.   Shana Daring, PA-C [facility] Gastroenterology 06/10/2023

## 2023-06-10 ENCOUNTER — Ambulatory Visit (INDEPENDENT_AMBULATORY_CARE_PROVIDER_SITE_OTHER): Admitting: Gastroenterology

## 2023-06-10 ENCOUNTER — Encounter: Payer: Self-pay | Admitting: Gastroenterology

## 2023-06-10 VITALS — BP 136/84 | HR 90 | Temp 97.8°F | Ht 67.0 in | Wt 186.8 lb

## 2023-06-10 DIAGNOSIS — K9089 Other intestinal malabsorption: Secondary | ICD-10-CM

## 2023-06-10 DIAGNOSIS — Z8719 Personal history of other diseases of the digestive system: Secondary | ICD-10-CM

## 2023-06-10 DIAGNOSIS — Z860101 Personal history of adenomatous and serrated colon polyps: Secondary | ICD-10-CM

## 2023-06-10 DIAGNOSIS — Z860100 Personal history of colon polyps, unspecified: Secondary | ICD-10-CM

## 2023-06-10 MED ORDER — COLESTIPOL HCL 1 G PO TABS: 2.0000 g | ORAL_TABLET | Freq: Every day | ORAL | 3 refills | Status: AC

## 2023-06-10 NOTE — Patient Instructions (Addendum)
 We will get you scheduled for a colonoscopy in about 6 weeks with Dr. Riley Cheadle.   You can stop your omeprazole  and famotidine for now. If you have any heartburn issues or upper abdominal pain, resume these medications.   Start colestid 2g daily for diarrhea secondary to not having your gallbladder (bile salt diarrhea). If you develop any constipation please hold this medications.   I will see you back after your colonoscopy.   It was great to meet you today!   Shana Daring, PA-C [facility] Gastroenterology

## 2023-06-18 DIAGNOSIS — R051 Acute cough: Secondary | ICD-10-CM | POA: Diagnosis not present

## 2023-06-18 DIAGNOSIS — J069 Acute upper respiratory infection, unspecified: Secondary | ICD-10-CM | POA: Diagnosis not present

## 2023-06-18 DIAGNOSIS — Z20828 Contact with and (suspected) exposure to other viral communicable diseases: Secondary | ICD-10-CM | POA: Diagnosis not present

## 2023-06-18 DIAGNOSIS — Z6828 Body mass index (BMI) 28.0-28.9, adult: Secondary | ICD-10-CM | POA: Diagnosis not present

## 2023-06-18 DIAGNOSIS — J309 Allergic rhinitis, unspecified: Secondary | ICD-10-CM | POA: Diagnosis not present

## 2023-06-24 ENCOUNTER — Telehealth: Payer: Self-pay | Admitting: *Deleted

## 2023-06-24 NOTE — Telephone Encounter (Signed)
 LMOVM to call back to schedule TCS with Dr. Riley Cheadle, ASA 3 (ok rm 1-2) in 6+ weeks from last OV

## 2023-06-29 DIAGNOSIS — K5732 Diverticulitis of large intestine without perforation or abscess without bleeding: Secondary | ICD-10-CM | POA: Diagnosis not present

## 2023-06-30 NOTE — Telephone Encounter (Signed)
 LMOVM to call back. Letter also mailed.

## 2023-07-09 DIAGNOSIS — J019 Acute sinusitis, unspecified: Secondary | ICD-10-CM | POA: Diagnosis not present

## 2023-07-09 DIAGNOSIS — Z6827 Body mass index (BMI) 27.0-27.9, adult: Secondary | ICD-10-CM | POA: Diagnosis not present

## 2023-07-09 DIAGNOSIS — M79621 Pain in right upper arm: Secondary | ICD-10-CM | POA: Diagnosis not present

## 2023-07-09 DIAGNOSIS — M7521 Bicipital tendinitis, right shoulder: Secondary | ICD-10-CM | POA: Diagnosis not present

## 2023-07-16 DIAGNOSIS — Z79899 Other long term (current) drug therapy: Secondary | ICD-10-CM | POA: Diagnosis not present

## 2023-07-16 DIAGNOSIS — Z9689 Presence of other specified functional implants: Secondary | ICD-10-CM | POA: Diagnosis not present

## 2023-07-16 DIAGNOSIS — Z4659 Encounter for fitting and adjustment of other gastrointestinal appliance and device: Secondary | ICD-10-CM | POA: Diagnosis not present

## 2023-07-16 DIAGNOSIS — J45909 Unspecified asthma, uncomplicated: Secondary | ICD-10-CM | POA: Diagnosis not present

## 2023-07-16 DIAGNOSIS — K8050 Calculus of bile duct without cholangitis or cholecystitis without obstruction: Secondary | ICD-10-CM | POA: Diagnosis not present

## 2023-07-16 DIAGNOSIS — K838 Other specified diseases of biliary tract: Secondary | ICD-10-CM | POA: Diagnosis not present

## 2023-07-16 DIAGNOSIS — I10 Essential (primary) hypertension: Secondary | ICD-10-CM | POA: Diagnosis not present

## 2023-07-16 DIAGNOSIS — R1011 Right upper quadrant pain: Secondary | ICD-10-CM | POA: Diagnosis not present

## 2023-07-16 DIAGNOSIS — Z9049 Acquired absence of other specified parts of digestive tract: Secondary | ICD-10-CM | POA: Diagnosis not present

## 2023-07-27 DIAGNOSIS — Z6828 Body mass index (BMI) 28.0-28.9, adult: Secondary | ICD-10-CM | POA: Diagnosis not present

## 2023-07-27 DIAGNOSIS — M79621 Pain in right upper arm: Secondary | ICD-10-CM | POA: Diagnosis not present

## 2023-07-27 DIAGNOSIS — Z9049 Acquired absence of other specified parts of digestive tract: Secondary | ICD-10-CM | POA: Diagnosis not present

## 2023-07-27 DIAGNOSIS — M7521 Bicipital tendinitis, right shoulder: Secondary | ICD-10-CM | POA: Diagnosis not present

## 2023-09-07 DIAGNOSIS — M7672 Peroneal tendinitis, left leg: Secondary | ICD-10-CM | POA: Diagnosis not present

## 2023-09-07 DIAGNOSIS — S86312A Strain of muscle(s) and tendon(s) of peroneal muscle group at lower leg level, left leg, initial encounter: Secondary | ICD-10-CM | POA: Diagnosis not present

## 2023-09-07 DIAGNOSIS — M75101 Unspecified rotator cuff tear or rupture of right shoulder, not specified as traumatic: Secondary | ICD-10-CM | POA: Diagnosis not present

## 2023-09-07 DIAGNOSIS — M25572 Pain in left ankle and joints of left foot: Secondary | ICD-10-CM | POA: Diagnosis not present

## 2023-09-07 DIAGNOSIS — M25511 Pain in right shoulder: Secondary | ICD-10-CM | POA: Diagnosis not present

## 2023-09-22 DIAGNOSIS — Z6828 Body mass index (BMI) 28.0-28.9, adult: Secondary | ICD-10-CM | POA: Diagnosis not present

## 2023-09-22 DIAGNOSIS — R252 Cramp and spasm: Secondary | ICD-10-CM | POA: Diagnosis not present

## 2023-09-22 DIAGNOSIS — R7301 Impaired fasting glucose: Secondary | ICD-10-CM | POA: Diagnosis not present

## 2023-09-22 DIAGNOSIS — R5383 Other fatigue: Secondary | ICD-10-CM | POA: Diagnosis not present

## 2023-09-22 DIAGNOSIS — Z1329 Encounter for screening for other suspected endocrine disorder: Secondary | ICD-10-CM | POA: Diagnosis not present

## 2023-09-22 DIAGNOSIS — R42 Dizziness and giddiness: Secondary | ICD-10-CM | POA: Diagnosis not present

## 2023-09-22 DIAGNOSIS — E7849 Other hyperlipidemia: Secondary | ICD-10-CM | POA: Diagnosis not present

## 2023-09-22 DIAGNOSIS — Z131 Encounter for screening for diabetes mellitus: Secondary | ICD-10-CM | POA: Diagnosis not present

## 2023-09-22 DIAGNOSIS — R300 Dysuria: Secondary | ICD-10-CM | POA: Diagnosis not present

## 2023-10-12 DIAGNOSIS — M25611 Stiffness of right shoulder, not elsewhere classified: Secondary | ICD-10-CM | POA: Diagnosis not present

## 2023-10-12 DIAGNOSIS — M75101 Unspecified rotator cuff tear or rupture of right shoulder, not specified as traumatic: Secondary | ICD-10-CM | POA: Diagnosis not present

## 2023-10-12 DIAGNOSIS — M25511 Pain in right shoulder: Secondary | ICD-10-CM | POA: Diagnosis not present

## 2023-10-24 DIAGNOSIS — E669 Obesity, unspecified: Secondary | ICD-10-CM | POA: Diagnosis not present

## 2023-10-24 DIAGNOSIS — Z792 Long term (current) use of antibiotics: Secondary | ICD-10-CM | POA: Diagnosis not present

## 2023-10-24 DIAGNOSIS — R1030 Lower abdominal pain, unspecified: Secondary | ICD-10-CM | POA: Diagnosis not present

## 2023-10-24 DIAGNOSIS — K5720 Diverticulitis of large intestine with perforation and abscess without bleeding: Secondary | ICD-10-CM | POA: Diagnosis not present

## 2023-10-24 DIAGNOSIS — J45909 Unspecified asthma, uncomplicated: Secondary | ICD-10-CM | POA: Diagnosis not present

## 2023-10-24 DIAGNOSIS — E785 Hyperlipidemia, unspecified: Secondary | ICD-10-CM | POA: Diagnosis not present

## 2023-10-24 DIAGNOSIS — K219 Gastro-esophageal reflux disease without esophagitis: Secondary | ICD-10-CM | POA: Diagnosis not present

## 2023-10-24 DIAGNOSIS — K5792 Diverticulitis of intestine, part unspecified, without perforation or abscess without bleeding: Secondary | ICD-10-CM | POA: Diagnosis not present

## 2023-10-24 DIAGNOSIS — Z7902 Long term (current) use of antithrombotics/antiplatelets: Secondary | ICD-10-CM | POA: Diagnosis not present

## 2023-10-24 DIAGNOSIS — E119 Type 2 diabetes mellitus without complications: Secondary | ICD-10-CM | POA: Diagnosis not present

## 2023-10-24 DIAGNOSIS — J449 Chronic obstructive pulmonary disease, unspecified: Secondary | ICD-10-CM | POA: Diagnosis not present

## 2023-10-24 DIAGNOSIS — G2581 Restless legs syndrome: Secondary | ICD-10-CM | POA: Diagnosis not present

## 2023-10-24 DIAGNOSIS — I10 Essential (primary) hypertension: Secondary | ICD-10-CM | POA: Diagnosis not present

## 2023-10-24 DIAGNOSIS — Z79899 Other long term (current) drug therapy: Secondary | ICD-10-CM | POA: Diagnosis not present

## 2023-10-24 DIAGNOSIS — K5732 Diverticulitis of large intestine without perforation or abscess without bleeding: Secondary | ICD-10-CM | POA: Diagnosis not present

## 2023-10-25 DIAGNOSIS — I10 Essential (primary) hypertension: Secondary | ICD-10-CM | POA: Diagnosis not present

## 2023-10-25 DIAGNOSIS — K5732 Diverticulitis of large intestine without perforation or abscess without bleeding: Secondary | ICD-10-CM | POA: Diagnosis not present

## 2023-10-25 DIAGNOSIS — Z792 Long term (current) use of antibiotics: Secondary | ICD-10-CM | POA: Diagnosis not present

## 2023-10-25 DIAGNOSIS — K5792 Diverticulitis of intestine, part unspecified, without perforation or abscess without bleeding: Secondary | ICD-10-CM | POA: Diagnosis not present

## 2023-10-25 DIAGNOSIS — E119 Type 2 diabetes mellitus without complications: Secondary | ICD-10-CM | POA: Diagnosis not present

## 2023-10-25 DIAGNOSIS — Z79899 Other long term (current) drug therapy: Secondary | ICD-10-CM | POA: Diagnosis not present

## 2023-10-25 DIAGNOSIS — K219 Gastro-esophageal reflux disease without esophagitis: Secondary | ICD-10-CM | POA: Diagnosis not present

## 2023-10-25 DIAGNOSIS — J449 Chronic obstructive pulmonary disease, unspecified: Secondary | ICD-10-CM | POA: Diagnosis not present

## 2023-10-25 DIAGNOSIS — E785 Hyperlipidemia, unspecified: Secondary | ICD-10-CM | POA: Diagnosis not present

## 2023-10-28 ENCOUNTER — Telehealth: Payer: Self-pay | Admitting: *Deleted

## 2023-10-28 NOTE — Transitions of Care (Post Inpatient/ED Visit) (Signed)
   10/28/2023  Name: Amanda [name] MRN: [id number] DOB: [birth year]  Today's TOC FU Call Status: Today's TOC FU Call Status:: Unsuccessful Call (1st Attempt) Unsuccessful Call (1st Attempt) Date: 10/28/23 (Person answering phone reports wrong number: confirms she nor anyone else in her family has been in hospital recently; confirmed no alternate number listed for patient in EHR, unable to find alternate number in outside hospital records; no DPR on file)  Attempted to reach the patient regarding the most recent Inpatient visit.  Follow Up Plan: No further outreach attempts will be made at this time. We have been unable to contact the patient.  Pls call/ message for questions,  Ocie Stanzione Mckinney Tahirih Lair, RN, BSN, CCRN Alumnus RN Care Manager  Transitions of Care  [facility] - [facility] [phone]: direct office

## 2023-11-10 DIAGNOSIS — Z1321 Encounter for screening for nutritional disorder: Secondary | ICD-10-CM | POA: Diagnosis not present

## 2023-11-10 DIAGNOSIS — K5792 Diverticulitis of intestine, part unspecified, without perforation or abscess without bleeding: Secondary | ICD-10-CM | POA: Diagnosis not present

## 2023-11-10 DIAGNOSIS — E782 Mixed hyperlipidemia: Secondary | ICD-10-CM | POA: Diagnosis not present

## 2023-11-10 DIAGNOSIS — Z6827 Body mass index (BMI) 27.0-27.9, adult: Secondary | ICD-10-CM | POA: Diagnosis not present

## 2023-11-10 DIAGNOSIS — R0789 Other chest pain: Secondary | ICD-10-CM | POA: Diagnosis not present

## 2023-11-10 DIAGNOSIS — I4510 Unspecified right bundle-branch block: Secondary | ICD-10-CM | POA: Diagnosis not present

## 2023-11-16 ENCOUNTER — Encounter: Payer: Self-pay | Admitting: Internal Medicine

## 2023-11-17 DIAGNOSIS — H52203 Unspecified astigmatism, bilateral: Secondary | ICD-10-CM | POA: Diagnosis not present

## 2023-11-17 DIAGNOSIS — H5203 Hypermetropia, bilateral: Secondary | ICD-10-CM | POA: Diagnosis not present

## 2023-11-17 DIAGNOSIS — H43812 Vitreous degeneration, left eye: Secondary | ICD-10-CM | POA: Diagnosis not present

## 2023-11-17 DIAGNOSIS — H2513 Age-related nuclear cataract, bilateral: Secondary | ICD-10-CM | POA: Diagnosis not present

## 2023-11-18 DIAGNOSIS — H524 Presbyopia: Secondary | ICD-10-CM | POA: Diagnosis not present

## 2023-11-26 DIAGNOSIS — K5792 Diverticulitis of intestine, part unspecified, without perforation or abscess without bleeding: Secondary | ICD-10-CM | POA: Diagnosis not present

## 2023-11-26 DIAGNOSIS — I4510 Unspecified right bundle-branch block: Secondary | ICD-10-CM | POA: Diagnosis not present

## 2023-11-26 DIAGNOSIS — R0789 Other chest pain: Secondary | ICD-10-CM | POA: Diagnosis not present

## 2023-11-30 ENCOUNTER — Encounter: Payer: Self-pay | Admitting: Internal Medicine

## 2023-11-30 DIAGNOSIS — R7303 Prediabetes: Secondary | ICD-10-CM | POA: Diagnosis not present

## 2023-11-30 DIAGNOSIS — Z0001 Encounter for general adult medical examination with abnormal findings: Secondary | ICD-10-CM | POA: Diagnosis not present

## 2023-11-30 DIAGNOSIS — R0789 Other chest pain: Secondary | ICD-10-CM | POA: Diagnosis not present

## 2023-11-30 DIAGNOSIS — M79621 Pain in right upper arm: Secondary | ICD-10-CM | POA: Diagnosis not present

## 2023-11-30 DIAGNOSIS — I10 Essential (primary) hypertension: Secondary | ICD-10-CM | POA: Diagnosis not present

## 2023-11-30 DIAGNOSIS — K219 Gastro-esophageal reflux disease without esophagitis: Secondary | ICD-10-CM | POA: Diagnosis not present

## 2023-11-30 DIAGNOSIS — Z23 Encounter for immunization: Secondary | ICD-10-CM | POA: Diagnosis not present

## 2023-11-30 DIAGNOSIS — I4510 Unspecified right bundle-branch block: Secondary | ICD-10-CM | POA: Diagnosis not present

## 2023-11-30 DIAGNOSIS — R0602 Shortness of breath: Secondary | ICD-10-CM | POA: Diagnosis not present

## 2023-12-07 ENCOUNTER — Encounter: Payer: Self-pay | Admitting: Gastroenterology

## 2023-12-07 ENCOUNTER — Encounter: Payer: Self-pay | Admitting: Cardiology

## 2024-01-06 ENCOUNTER — Ambulatory Visit: Admitting: Gastroenterology

## 2024-01-08 ENCOUNTER — Encounter: Payer: Self-pay | Admitting: Gastroenterology

## 2024-01-15 ENCOUNTER — Ambulatory Visit: Admitting: Cardiology

## 2024-02-03 ENCOUNTER — Ambulatory Visit: Admitting: Cardiology

## 2024-02-03 ENCOUNTER — Ambulatory Visit: Attending: Cardiology | Admitting: Cardiology

## 2024-02-03 NOTE — Progress Notes (Unsigned)
 "     Clinical Summary Ms. Sardinas is a 69 y.o.female seen today as a new consult. Referred by PA [facility] for the following medical problems.  Previously followed by [facility] cardiology, last visit 03/2020    1.Chest pain - previously evaluated by [facility] cards - January 2015: Exercise MPI, 7 METS, reported as low risk study  - Coronary CTA shows: CAD-RADS 0 - No Plaque or Stenosis  08/2021 echo [facility]: LVEF 65%, grade I dd, normal RV   2. DOE - from [facility] cards notes long history of DOE - extensive cardiac testing at [facility] benign - 03/2020 echo: LVEF 65-70%, grade I dd, normal RV, no pulm HTN -  03/2020 Coronary CTA shows: CAD-RADS 0 - No Plaque or Stenosis  - 08/2021 echo [facility]: LVEF 65%, grade I dd, normal RV  3.?COPD/asthma  Past Medical History:  Diagnosis Date   Adult BMI 30+    Arthralgia    COPD (chronic obstructive pulmonary disease) (HCC)    Depression    Essential hypertension, benign    Hot flashes    Hypercholesteremia    Iron deficiency anemia    Menometrorrhagia   Obesity    Type 2 diabetes mellitus (HCC)      Allergies[1]   Current Outpatient Medications  Medication Sig Dispense Refill   acetaminophen  (TYLENOL ) 500 MG tablet Take 500 mg by mouth as needed.     albuterol (PROVENTIL HFA;VENTOLIN HFA) 108 (90 BASE) MCG/ACT inhaler Inhale 1 puff into the lungs every 6 (six) hours as needed for wheezing or shortness of breath.     amitriptyline (ELAVIL) 25 MG tablet Take 25 mg by mouth at bedtime.     aspirin 81 MG tablet Take 81 mg by mouth.      cholecalciferol (VITAMIN D) 1000 units tablet Take 1,000 Units by mouth daily.     colestipol  (COLESTID ) 1 g tablet Take 2 tablets (2 g total) by mouth daily. 60 tablet 3   fish oil-omega-3 fatty acids 1000 MG capsule Take 2 g by mouth daily.     gabapentin  (NEURONTIN ) 100 MG capsule Take 1 capsule (100 mg total) by mouth 3 (three) times daily as needed. 30 capsule 0   hydroxypropyl methylcellulose (ISOPTO TEARS) 2.5 % ophthalmic  solution Place 1 drop into both eyes daily as needed for dry eyes. (Patient not taking: Reported on 06/10/2023)     loratadine (CLARITIN) 10 MG tablet Take 10 mg by mouth daily.     ondansetron  (ZOFRAN ) 4 MG tablet Take 1 tablet (4 mg total) by mouth every 8 (eight) hours as needed for nausea or vomiting. 30 tablet 1   sertraline (ZOLOFT) 25 MG tablet Take 25 mg by mouth daily.     simvastatin (ZOCOR) 20 MG tablet Take 20 mg by mouth daily.     traZODone (DESYREL) 50 MG tablet Take 1 tablet by mouth at bedtime.  2   valACYclovir  (VALTREX ) 1000 MG tablet Take 1 tablet (1,000 mg total) by mouth 3 (three) times daily. 21 tablet 0   vitamin B-12 (CYANOCOBALAMIN ) 1000 MCG tablet Take 1,000 mcg by mouth daily.     No current facility-administered medications for this visit.     Past Surgical History:  Procedure Laterality Date   ABDOMINAL HYSTERECTOMY     BIOPSY  06/05/2016   Procedure: BIOPSY;  Surgeon: Shaaron Lamar HERO, MD;  Location: [facility];  Service: Endoscopy;;  gastric   COLONOSCOPY N/A 12/06/2012   Dr. Shaaron: tubular adenoma. Surveillance in 2019  ESOPHAGOGASTRODUODENOSCOPY N/A 06/05/2016   Dr. Shaaron: normal esophagus, small hiatal hernia, chronic gastritis, duodenal erosions    Excision pilonidal abscess     MALONEY DILATION N/A 06/05/2016   Procedure: AGAPITO DILATION;  Surgeon: Shaaron Lamar HERO, MD;  Location: [facility];  Service: Endoscopy;  Laterality: N/A;   TUBAL LIGATION       Allergies[2]    Family History  Problem Relation Age of Onset   Breast cancer Mother    Colon cancer Brother 54   Breast cancer Maternal Aunt      Social History Ms. Tenorio reports that she has never smoked. She has never used smokeless tobacco. Ms. Hilgert reports no history of alcohol use.   Review of Systems CONSTITUTIONAL: No weight loss, fever, chills, weakness or fatigue.  HEENT: Eyes: No visual loss, blurred vision, double vision or yellow sclerae.No hearing loss, sneezing,  congestion, runny nose or sore throat.  SKIN: No rash or itching.  CARDIOVASCULAR:  RESPIRATORY: No shortness of breath, cough or sputum.  GASTROINTESTINAL: No anorexia, nausea, vomiting or diarrhea. No abdominal pain or blood.  GENITOURINARY: No burning on urination, no polyuria NEUROLOGICAL: No headache, dizziness, syncope, paralysis, ataxia, numbness or tingling in the extremities. No change in bowel or bladder control.  MUSCULOSKELETAL: No muscle, back pain, joint pain or stiffness.  LYMPHATICS: No enlarged nodes. No history of splenectomy.  PSYCHIATRIC: No history of depression or anxiety.  ENDOCRINOLOGIC: No reports of sweating, cold or heat intolerance. No polyuria or polydipsia.  SABRA   Physical Examination There were no vitals filed for this visit. There were no vitals filed for this visit.  Gen: resting comfortably, no acute distress HEENT: no scleral icterus, pupils equal round and reactive, no palptable cervical adenopathy,  CV Resp: Clear to auscultation bilaterally GI: abdomen is soft, non-tender, non-distended, normal bowel sounds, no hepatosplenomegaly MSK: extremities are warm, no edema.  Skin: warm, no rash Neuro:  no focal deficits Psych: appropriate affect   Diagnostic Studies     Assessment and Plan        Dorn PHEBE Ross, M.D., F.A.C.C.    [1] No Known Allergies [2] No Known Allergies  "

## 2024-02-16 ENCOUNTER — Ambulatory Visit: Admitting: Cardiology

## 2024-03-01 ENCOUNTER — Ambulatory Visit: Admitting: Gastroenterology
